# Patient Record
Sex: Male | Born: 1970 | Race: White | Hispanic: No | Marital: Married | Smoking: Current every day smoker
Health system: Southern US, Community
[De-identification: ages and names within clinical notes are randomized; demographics above are authoritative.]

## PROBLEM LIST (undated history)

## (undated) DIAGNOSIS — K529 Noninfective gastroenteritis and colitis, unspecified: Secondary | ICD-10-CM

## (undated) DIAGNOSIS — K5190 Ulcerative colitis, unspecified, without complications: Secondary | ICD-10-CM

## (undated) DIAGNOSIS — F32A Depression, unspecified: Secondary | ICD-10-CM

## (undated) DIAGNOSIS — R7989 Other specified abnormal findings of blood chemistry: Secondary | ICD-10-CM

## (undated) DIAGNOSIS — N509 Disorder of male genital organs, unspecified: Secondary | ICD-10-CM

## (undated) DIAGNOSIS — D126 Benign neoplasm of colon, unspecified: Secondary | ICD-10-CM

## (undated) DIAGNOSIS — F329 Major depressive disorder, single episode, unspecified: Secondary | ICD-10-CM

## (undated) DIAGNOSIS — R12 Heartburn: Secondary | ICD-10-CM

## (undated) DIAGNOSIS — R945 Abnormal results of liver function studies: Secondary | ICD-10-CM

## (undated) DIAGNOSIS — K6289 Other specified diseases of anus and rectum: Secondary | ICD-10-CM

## (undated) HISTORY — DX: Depression, unspecified: F32.A

## (undated) HISTORY — DX: Major depressive disorder, single episode, unspecified: F32.9

## (undated) HISTORY — DX: Other specified diseases of anus and rectum: K62.89

## (undated) HISTORY — DX: Other specified abnormal findings of blood chemistry: R79.89

## (undated) HISTORY — DX: Disorder of male genital organs, unspecified: N50.9

## (undated) HISTORY — DX: Abnormal results of liver function studies: R94.5

## (undated) HISTORY — DX: Ulcerative colitis, unspecified, without complications: K51.90

## (undated) HISTORY — DX: Benign neoplasm of colon, unspecified: D12.6

## (undated) HISTORY — DX: Heartburn: R12

## (undated) HISTORY — DX: Noninfective gastroenteritis and colitis, unspecified: K52.9

---

## 2002-09-11 ENCOUNTER — Emergency Department (HOSPITAL_COMMUNITY): Admission: EM | Admit: 2002-09-11 | Discharge: 2002-09-11 | Payer: Self-pay | Admitting: Emergency Medicine

## 2002-09-11 ENCOUNTER — Encounter: Payer: Self-pay | Admitting: Emergency Medicine

## 2006-01-13 ENCOUNTER — Ambulatory Visit: Payer: Self-pay | Admitting: Gastroenterology

## 2006-01-26 ENCOUNTER — Ambulatory Visit: Payer: Self-pay | Admitting: Gastroenterology

## 2006-01-26 ENCOUNTER — Encounter (INDEPENDENT_AMBULATORY_CARE_PROVIDER_SITE_OTHER): Payer: Self-pay | Admitting: *Deleted

## 2006-03-08 ENCOUNTER — Ambulatory Visit: Payer: Self-pay | Admitting: Gastroenterology

## 2007-08-11 ENCOUNTER — Ambulatory Visit: Payer: Self-pay | Admitting: Gastroenterology

## 2007-10-02 ENCOUNTER — Ambulatory Visit: Payer: Self-pay | Admitting: Gastroenterology

## 2008-01-10 DIAGNOSIS — D126 Benign neoplasm of colon, unspecified: Secondary | ICD-10-CM | POA: Insufficient documentation

## 2008-01-10 DIAGNOSIS — Z9889 Other specified postprocedural states: Secondary | ICD-10-CM

## 2008-01-10 DIAGNOSIS — E299 Testicular dysfunction, unspecified: Secondary | ICD-10-CM | POA: Insufficient documentation

## 2008-01-10 DIAGNOSIS — K5289 Other specified noninfective gastroenteritis and colitis: Secondary | ICD-10-CM | POA: Insufficient documentation

## 2008-01-10 DIAGNOSIS — A088 Other specified intestinal infections: Secondary | ICD-10-CM

## 2008-01-10 DIAGNOSIS — K6289 Other specified diseases of anus and rectum: Secondary | ICD-10-CM | POA: Insufficient documentation

## 2008-02-15 ENCOUNTER — Ambulatory Visit (HOSPITAL_COMMUNITY): Admission: RE | Admit: 2008-02-15 | Discharge: 2008-02-16 | Payer: Self-pay | Admitting: Specialist

## 2008-08-25 ENCOUNTER — Encounter: Payer: Self-pay | Admitting: Gastroenterology

## 2008-08-28 ENCOUNTER — Telehealth: Payer: Self-pay | Admitting: Gastroenterology

## 2008-09-04 ENCOUNTER — Ambulatory Visit: Payer: Self-pay | Admitting: Gastroenterology

## 2008-09-04 DIAGNOSIS — R1013 Epigastric pain: Secondary | ICD-10-CM | POA: Insufficient documentation

## 2008-09-04 DIAGNOSIS — R740 Nonspecific elevation of levels of transaminase and lactic acid dehydrogenase [LDH]: Secondary | ICD-10-CM

## 2008-09-06 ENCOUNTER — Ambulatory Visit (HOSPITAL_COMMUNITY): Admission: RE | Admit: 2008-09-06 | Discharge: 2008-09-06 | Payer: Self-pay | Admitting: Gastroenterology

## 2008-09-09 ENCOUNTER — Telehealth: Payer: Self-pay | Admitting: Internal Medicine

## 2008-09-09 ENCOUNTER — Encounter: Payer: Self-pay | Admitting: Gastroenterology

## 2008-09-09 ENCOUNTER — Ambulatory Visit: Payer: Self-pay | Admitting: Gastroenterology

## 2008-09-09 LAB — CONVERTED CEMR LAB
Albumin: 3.2 g/dL — ABNORMAL LOW (ref 3.5–5.2)
CO2: 31 meq/L (ref 19–32)
GFR calc Af Amer: 123 mL/min
GFR calc non Af Amer: 101 mL/min
Glucose, Bld: 110 mg/dL — ABNORMAL HIGH (ref 70–99)
Hemoglobin: 15.2 g/dL (ref 13.0–17.0)
Lymphocytes Relative: 12.7 % (ref 12.0–46.0)
Monocytes Relative: 11.6 % (ref 3.0–12.0)
Neutro Abs: 9.4 10*3/uL — ABNORMAL HIGH (ref 1.4–7.7)
RBC: 4.91 M/uL (ref 4.22–5.81)
RDW: 12.5 % (ref 11.5–14.6)
Sodium: 138 meq/L (ref 135–145)
Total Bilirubin: 0.8 mg/dL (ref 0.3–1.2)
Total Protein: 7.3 g/dL (ref 6.0–8.3)

## 2008-09-10 ENCOUNTER — Telehealth: Payer: Self-pay | Admitting: Gastroenterology

## 2008-09-12 ENCOUNTER — Emergency Department (HOSPITAL_COMMUNITY): Admission: EM | Admit: 2008-09-12 | Discharge: 2008-09-13 | Payer: Self-pay | Admitting: Emergency Medicine

## 2008-09-12 ENCOUNTER — Encounter: Payer: Self-pay | Admitting: Gastroenterology

## 2008-10-08 ENCOUNTER — Ambulatory Visit: Payer: Self-pay | Admitting: Gastroenterology

## 2008-10-08 DIAGNOSIS — K269 Duodenal ulcer, unspecified as acute or chronic, without hemorrhage or perforation: Secondary | ICD-10-CM | POA: Insufficient documentation

## 2008-10-08 DIAGNOSIS — K5190 Ulcerative colitis, unspecified, without complications: Secondary | ICD-10-CM | POA: Insufficient documentation

## 2008-11-05 ENCOUNTER — Ambulatory Visit: Payer: Self-pay | Admitting: Gastroenterology

## 2009-02-26 ENCOUNTER — Telehealth: Payer: Self-pay | Admitting: Gastroenterology

## 2009-03-18 ENCOUNTER — Ambulatory Visit: Payer: Self-pay | Admitting: Gastroenterology

## 2009-04-07 ENCOUNTER — Telehealth: Payer: Self-pay | Admitting: Gastroenterology

## 2009-04-08 ENCOUNTER — Ambulatory Visit: Payer: Self-pay | Admitting: Gastroenterology

## 2009-04-08 LAB — CONVERTED CEMR LAB
ALT: 20 units/L (ref 0–53)
AST: 14 units/L (ref 0–37)
Albumin: 3.3 g/dL — ABNORMAL LOW (ref 3.5–5.2)
Basophils Absolute: 0 10*3/uL (ref 0.0–0.1)
Calcium: 9.1 mg/dL (ref 8.4–10.5)
Chloride: 102 meq/L (ref 96–112)
Eosinophils Absolute: 0 10*3/uL (ref 0.0–0.7)
Lymphocytes Relative: 11.8 % — ABNORMAL LOW (ref 12.0–46.0)
MCHC: 35.3 g/dL (ref 30.0–36.0)
Neutrophils Relative %: 72.2 % (ref 43.0–77.0)
Platelets: 267 10*3/uL (ref 150.0–400.0)
Potassium: 3.4 meq/L — ABNORMAL LOW (ref 3.5–5.1)
RBC: 4.56 M/uL (ref 4.22–5.81)
RDW: 13 % (ref 11.5–14.6)

## 2009-04-09 ENCOUNTER — Inpatient Hospital Stay (HOSPITAL_COMMUNITY): Admission: EM | Admit: 2009-04-09 | Discharge: 2009-04-18 | Payer: Self-pay | Admitting: Emergency Medicine

## 2009-04-09 ENCOUNTER — Encounter: Payer: Self-pay | Admitting: Gastroenterology

## 2009-04-09 ENCOUNTER — Ambulatory Visit: Payer: Self-pay | Admitting: Internal Medicine

## 2009-04-09 ENCOUNTER — Telehealth: Payer: Self-pay | Admitting: Gastroenterology

## 2009-04-14 ENCOUNTER — Encounter: Payer: Self-pay | Admitting: Internal Medicine

## 2009-04-23 ENCOUNTER — Encounter: Payer: Self-pay | Admitting: Gastroenterology

## 2009-04-23 ENCOUNTER — Ambulatory Visit: Payer: Self-pay | Admitting: Internal Medicine

## 2009-04-23 ENCOUNTER — Encounter: Payer: Self-pay | Admitting: Nurse Practitioner

## 2009-04-23 DIAGNOSIS — K5130 Ulcerative (chronic) rectosigmoiditis without complications: Secondary | ICD-10-CM | POA: Insufficient documentation

## 2009-04-23 DIAGNOSIS — R12 Heartburn: Secondary | ICD-10-CM

## 2009-04-29 ENCOUNTER — Telehealth: Payer: Self-pay | Admitting: Gastroenterology

## 2009-04-29 ENCOUNTER — Telehealth (INDEPENDENT_AMBULATORY_CARE_PROVIDER_SITE_OTHER): Payer: Self-pay | Admitting: *Deleted

## 2009-04-30 ENCOUNTER — Telehealth: Payer: Self-pay | Admitting: Gastroenterology

## 2009-05-07 ENCOUNTER — Telehealth: Payer: Self-pay | Admitting: Gastroenterology

## 2009-05-13 ENCOUNTER — Encounter (HOSPITAL_COMMUNITY): Admission: RE | Admit: 2009-05-13 | Discharge: 2009-08-11 | Payer: Self-pay | Admitting: Gastroenterology

## 2009-05-14 ENCOUNTER — Encounter: Payer: Self-pay | Admitting: Gastroenterology

## 2009-05-15 ENCOUNTER — Telehealth: Payer: Self-pay | Admitting: Gastroenterology

## 2009-05-23 ENCOUNTER — Ambulatory Visit: Payer: Self-pay | Admitting: Gastroenterology

## 2009-05-26 LAB — CONVERTED CEMR LAB
BUN: 17 mg/dL (ref 6–23)
Basophils Relative: 1.1 % (ref 0.0–3.0)
Calcium: 8.8 mg/dL (ref 8.4–10.5)
Eosinophils Absolute: 0.2 10*3/uL (ref 0.0–0.7)
GFR calc non Af Amer: 115.68 mL/min (ref >60)
HCT: 35.2 % — ABNORMAL LOW (ref 39.0–52.0)
Lymphs Abs: 2.8 10*3/uL (ref 0.7–4.0)
MCHC: 33.6 g/dL (ref 30.0–36.0)
MCV: 90.4 fL (ref 78.0–100.0)
Monocytes Absolute: 0.9 10*3/uL (ref 0.1–1.0)
Neutrophils Relative %: 66.8 % (ref 43.0–77.0)
Platelets: 336 10*3/uL (ref 150.0–400.0)
Potassium: 3.4 meq/L — ABNORMAL LOW (ref 3.5–5.1)
RBC: 3.89 M/uL — ABNORMAL LOW (ref 4.22–5.81)

## 2009-05-27 ENCOUNTER — Ambulatory Visit: Payer: Self-pay | Admitting: Gastroenterology

## 2009-06-11 ENCOUNTER — Ambulatory Visit: Payer: Self-pay | Admitting: Gastroenterology

## 2009-06-12 ENCOUNTER — Telehealth (INDEPENDENT_AMBULATORY_CARE_PROVIDER_SITE_OTHER): Payer: Self-pay | Admitting: *Deleted

## 2009-06-12 LAB — CONVERTED CEMR LAB
ALT: 48 units/L (ref 0–53)
AST: 35 units/L (ref 0–37)
Alkaline Phosphatase: 34 units/L — ABNORMAL LOW (ref 39–117)
Basophils Absolute: 0.1 10*3/uL (ref 0.0–0.1)
CO2: 30 meq/L (ref 19–32)
Creatinine, Ser: 0.8 mg/dL (ref 0.4–1.5)
Eosinophils Absolute: 0.1 10*3/uL (ref 0.0–0.7)
GFR calc non Af Amer: 115.65 mL/min (ref >60)
HCT: 35.6 % — ABNORMAL LOW (ref 39.0–52.0)
Hemoglobin: 11.9 g/dL — ABNORMAL LOW (ref 13.0–17.0)
Lymphs Abs: 2.4 10*3/uL (ref 0.7–4.0)
MCHC: 33.4 g/dL (ref 30.0–36.0)
MCV: 90.8 fL (ref 78.0–100.0)
Monocytes Absolute: 0.8 10*3/uL (ref 0.1–1.0)
Neutro Abs: 10.4 10*3/uL — ABNORMAL HIGH (ref 1.4–7.7)
Platelets: 262 10*3/uL (ref 150.0–400.0)
RDW: 15.3 % — ABNORMAL HIGH (ref 11.5–14.6)
Sodium: 142 meq/L (ref 135–145)
Total Bilirubin: 1.1 mg/dL (ref 0.3–1.2)
Total Protein: 6.4 g/dL (ref 6.0–8.3)

## 2009-06-24 ENCOUNTER — Ambulatory Visit: Payer: Self-pay | Admitting: Gastroenterology

## 2009-06-25 ENCOUNTER — Telehealth (INDEPENDENT_AMBULATORY_CARE_PROVIDER_SITE_OTHER): Payer: Self-pay | Admitting: *Deleted

## 2009-06-25 LAB — CONVERTED CEMR LAB
Basophils Absolute: 0.1 10*3/uL (ref 0.0–0.1)
Basophils Relative: 0.9 % (ref 0.0–3.0)
CO2: 30 meq/L (ref 19–32)
Calcium: 8.9 mg/dL (ref 8.4–10.5)
Creatinine, Ser: 1.1 mg/dL (ref 0.4–1.5)
Eosinophils Absolute: 0.1 10*3/uL (ref 0.0–0.7)
GFR calc non Af Amer: 80.07 mL/min (ref >60)
Hemoglobin: 13.4 g/dL (ref 13.0–17.0)
Lymphocytes Relative: 27.8 % (ref 12.0–46.0)
MCHC: 34.3 g/dL (ref 30.0–36.0)
Monocytes Relative: 6.9 % (ref 3.0–12.0)
Neutro Abs: 4.5 10*3/uL (ref 1.4–7.7)
Neutrophils Relative %: 63.1 % (ref 43.0–77.0)
RBC: 4.2 M/uL — ABNORMAL LOW (ref 4.22–5.81)
RDW: 15.8 % — ABNORMAL HIGH (ref 11.5–14.6)
Sodium: 145 meq/L (ref 135–145)

## 2009-07-11 ENCOUNTER — Telehealth: Payer: Self-pay | Admitting: Gastroenterology

## 2009-08-06 IMAGING — US US ABDOMEN COMPLETE
1 series · 14 of 25 positions shown · non-contrast
Comparison: None

CLINICAL DATA: Abnormal LFTs

ABDOMEN ULTRASOUND
TECHNIQUE: Complete abdominal ultrasound examination was performed
including evaluation of the liver, gallbladder, bile ducts,
pancreas, kidneys, spleen, IVC, and abdominal aorta.

[Series 1: unknown · 0.27mm/px · 14 of 56 slices shown]
[im 1/56]
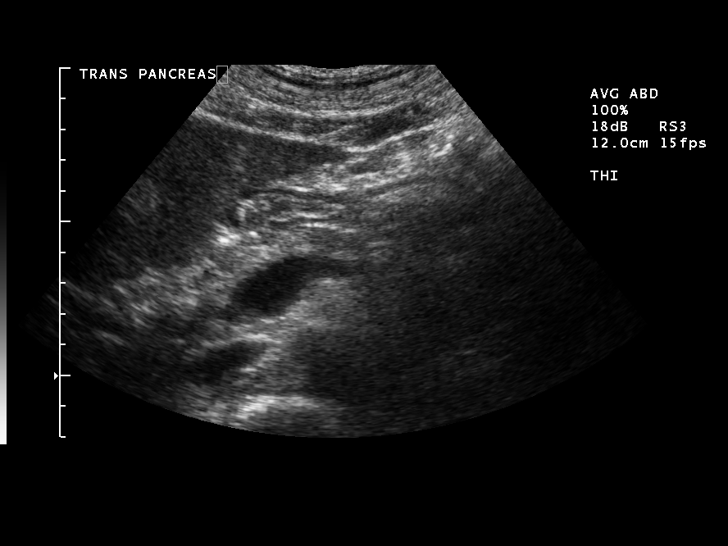
[im 5/56]
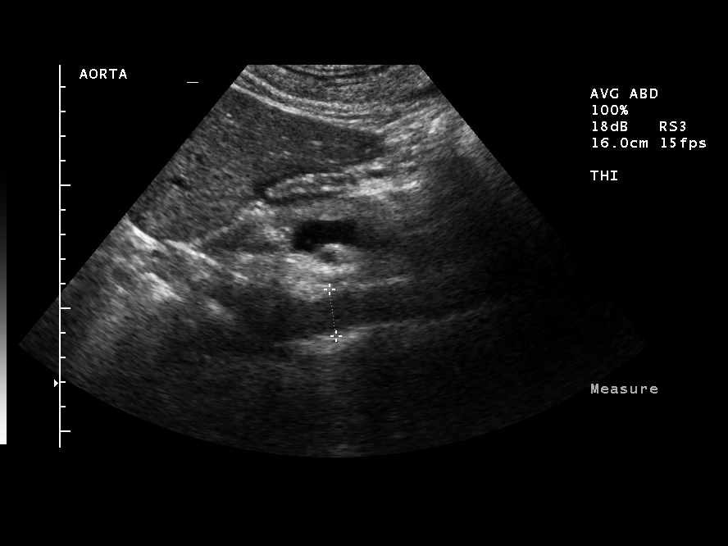
[im 10/56]
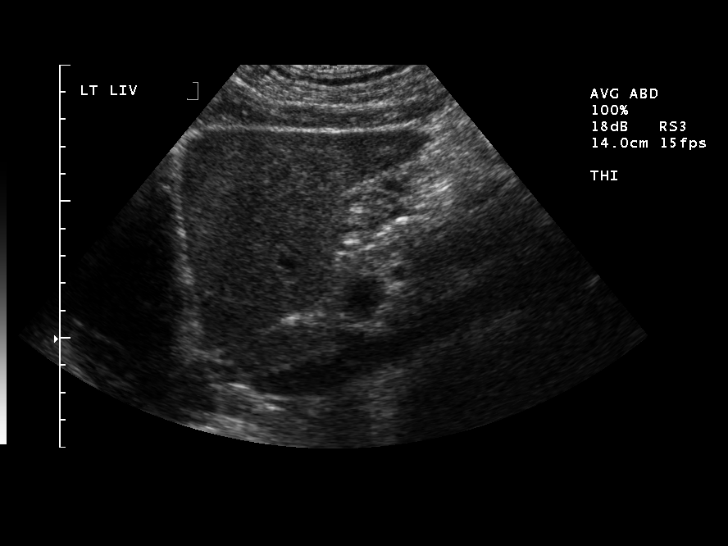
[im 14/56]
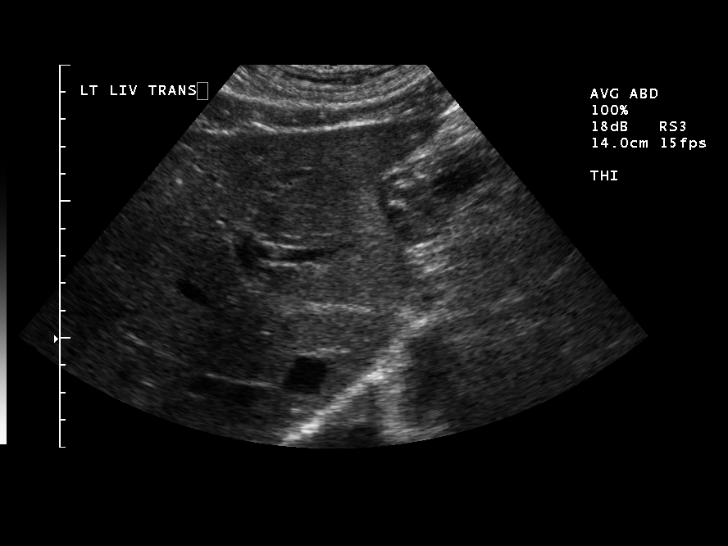
[im 19/56]
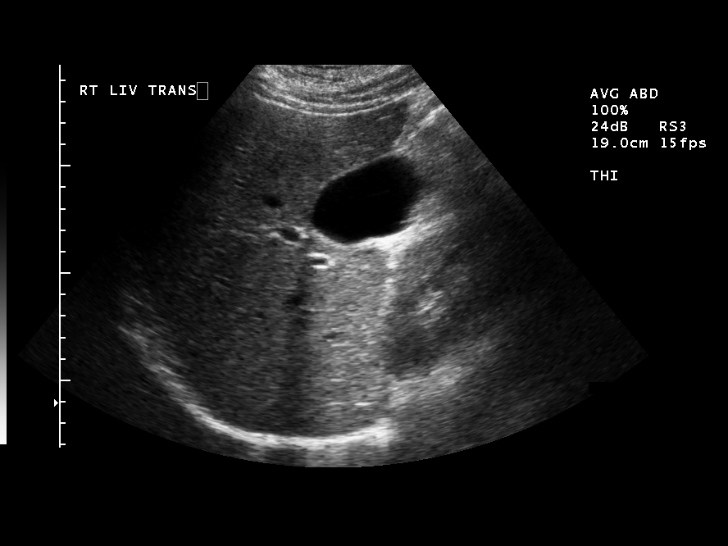
[im 21/56]
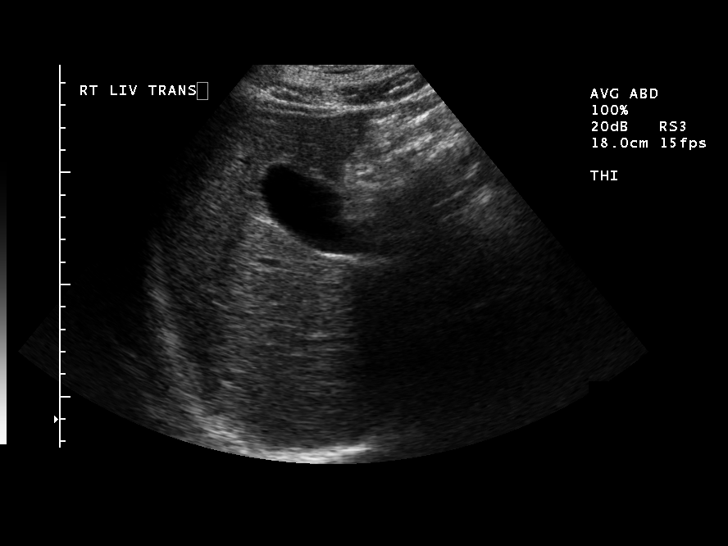
[im 26/56]
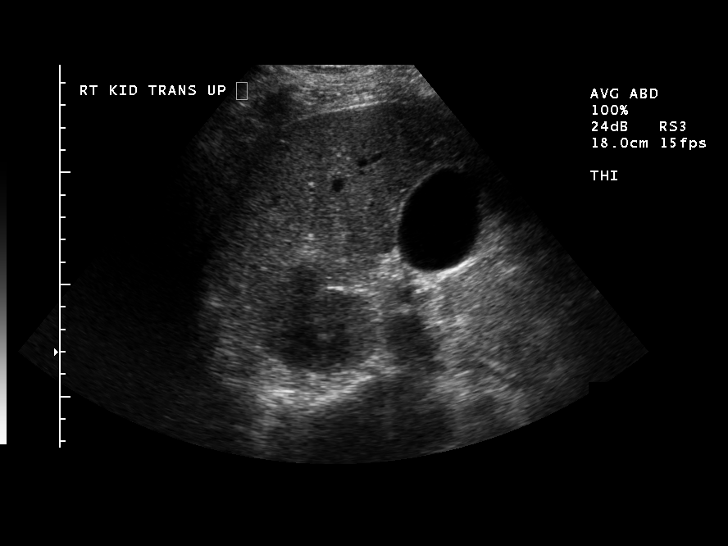
[im 30/56]
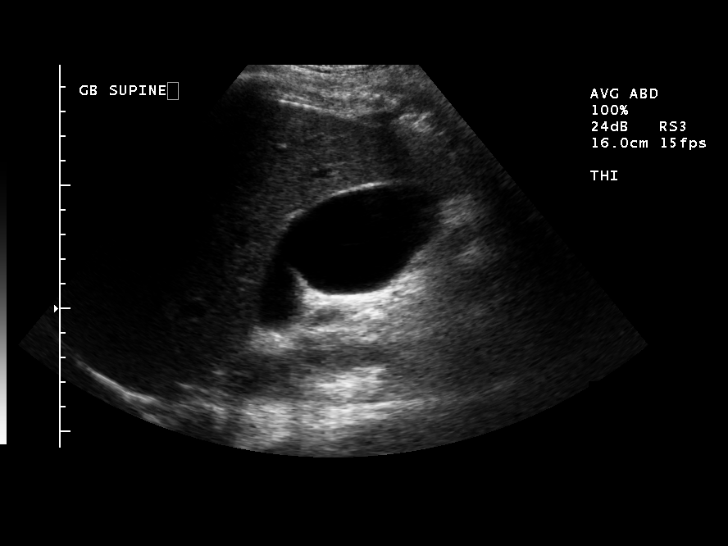
[im 35/56]
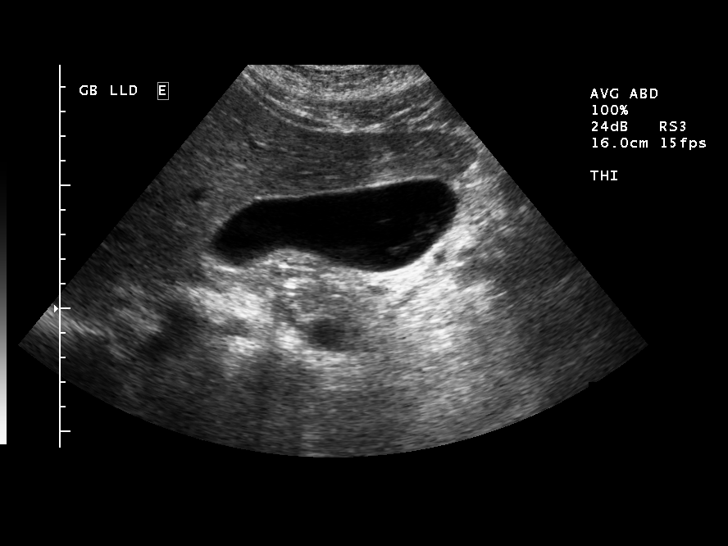
[im 37/56]
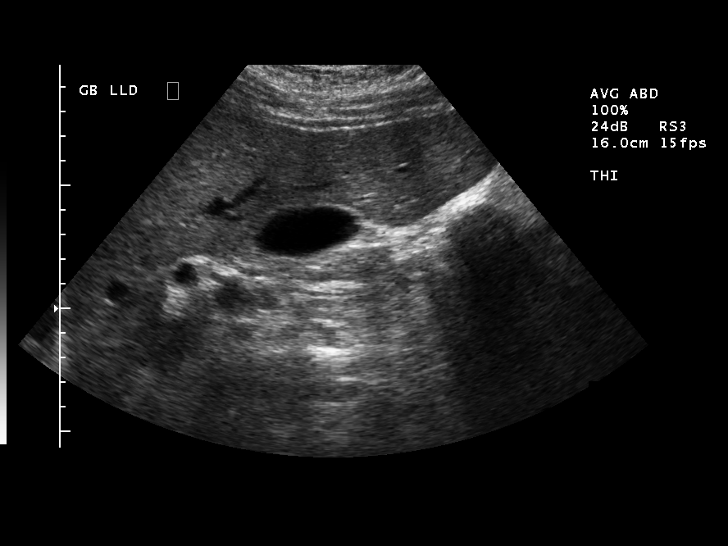
[im 42/56]
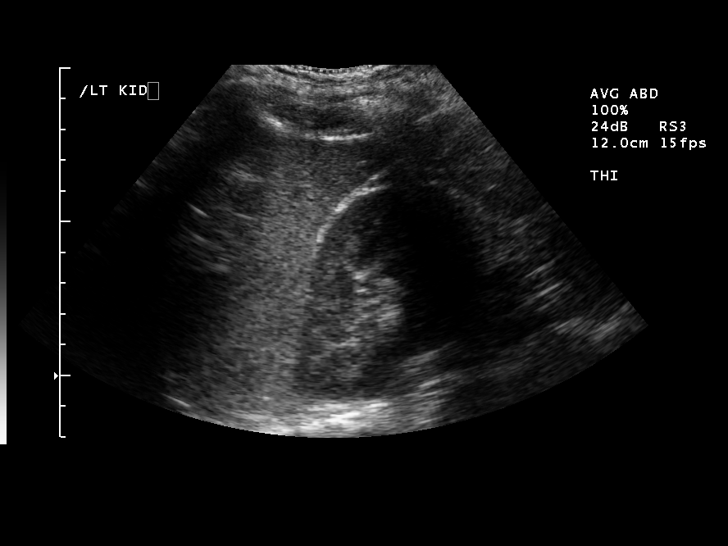
[im 46/56]
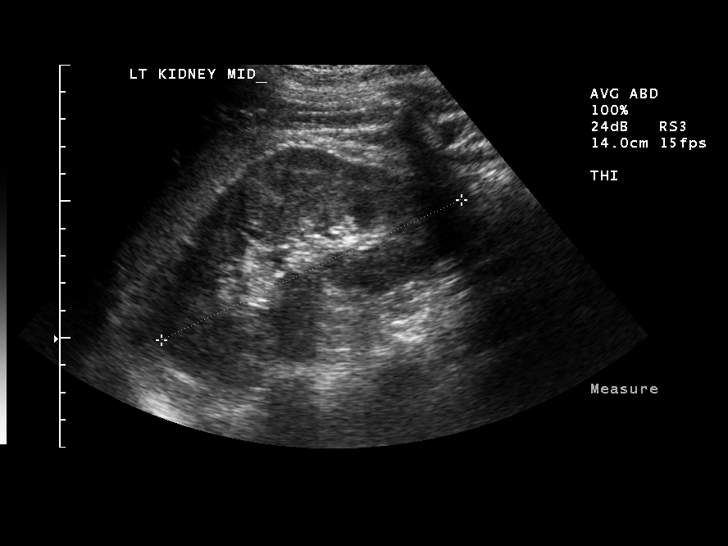
[im 51/56]
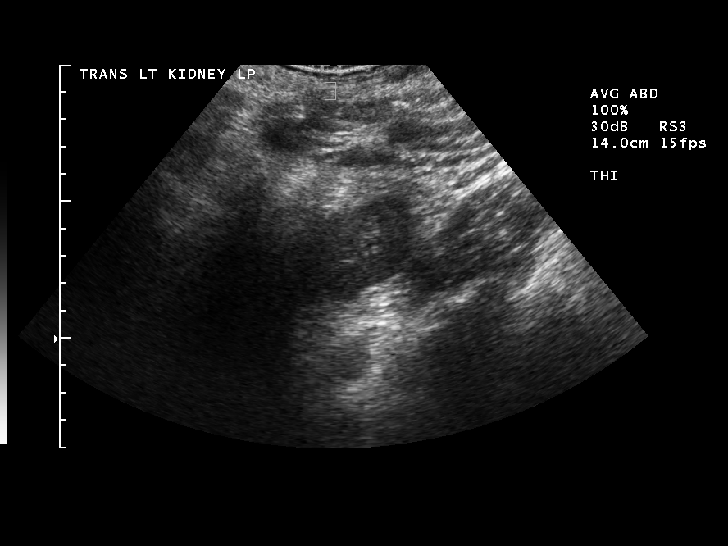
[im 56/56]
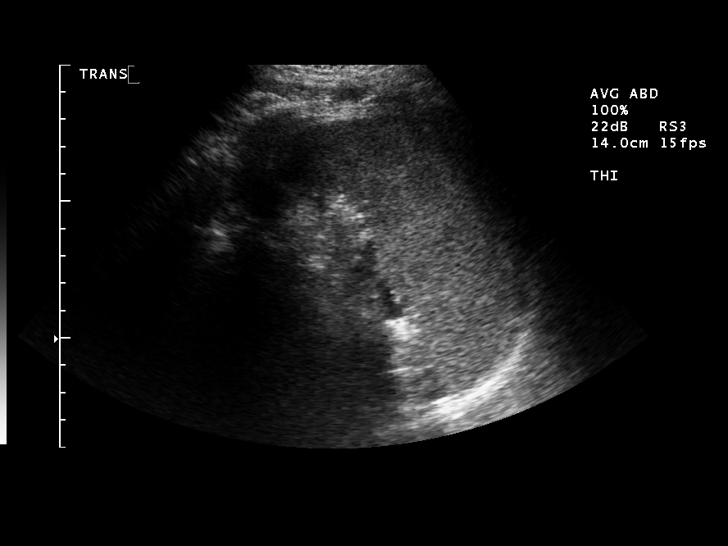

[14 of 25 positions shown; findings below may reference images not displayed]

FINDINGS: Normal gallbladder.  Negative Murphy's sign.  Gallbladder
wall thickness is 2.7 mm.  Common duct measures 3.3 mm which is
within normal limits.  No hepatic abnormality.  Pancreatic tail is
not visualized due to adjacent bowel gas.  Pancreas otherwise
appears normal. .  The spleen and kidneys are unremarkable.  The
spleen measures 8.8 cm in length.  The right and left kidneys
measure 12.6 and 12.1 cm in length, respectively.  Patent IVC.
Abdominal aorta maximal diameter is 2.0 cm.
IMPRESSION: No hepatic abnormality.  Normal gallbladder.  No biliary ductal
dilatation.  See comments above.

## 2009-08-14 ENCOUNTER — Encounter: Payer: Self-pay | Admitting: Gastroenterology

## 2009-08-18 ENCOUNTER — Encounter (HOSPITAL_COMMUNITY): Admission: RE | Admit: 2009-08-18 | Discharge: 2009-11-16 | Payer: Self-pay | Admitting: Gastroenterology

## 2009-09-08 ENCOUNTER — Ambulatory Visit: Payer: Self-pay | Admitting: Gastroenterology

## 2009-09-09 ENCOUNTER — Ambulatory Visit: Payer: Self-pay | Admitting: Gastroenterology

## 2009-09-09 LAB — CONVERTED CEMR LAB
BUN: 19 mg/dL (ref 6–23)
Basophils Relative: 0.9 % (ref 0.0–3.0)
CO2: 30 meq/L (ref 19–32)
Calcium: 8.7 mg/dL (ref 8.4–10.5)
Chloride: 102 meq/L (ref 96–112)
Creatinine, Ser: 0.8 mg/dL (ref 0.4–1.5)
GFR calc non Af Amer: 115.49 mL/min (ref >60)
Glucose, Bld: 94 mg/dL (ref 70–99)
HCT: 38.8 % — ABNORMAL LOW (ref 39.0–52.0)
Hemoglobin: 13.5 g/dL (ref 13.0–17.0)
Lymphocytes Relative: 20.2 % (ref 12.0–46.0)
Lymphs Abs: 1.7 10*3/uL (ref 0.7–4.0)
Monocytes Relative: 6.3 % (ref 3.0–12.0)
Neutro Abs: 5.9 10*3/uL (ref 1.4–7.7)
RBC: 4.23 M/uL (ref 4.22–5.81)
Total Bilirubin: 1.2 mg/dL (ref 0.3–1.2)

## 2009-09-22 ENCOUNTER — Telehealth (INDEPENDENT_AMBULATORY_CARE_PROVIDER_SITE_OTHER): Payer: Self-pay | Admitting: *Deleted

## 2009-09-23 ENCOUNTER — Ambulatory Visit: Payer: Self-pay | Admitting: Gastroenterology

## 2009-09-24 LAB — CONVERTED CEMR LAB
ALT: 20 units/L (ref 0–53)
Basophils Absolute: 0.1 10*3/uL (ref 0.0–0.1)
Basophils Relative: 1.2 % (ref 0.0–3.0)
CO2: 30 meq/L (ref 19–32)
Calcium: 9.2 mg/dL (ref 8.4–10.5)
Chloride: 106 meq/L (ref 96–112)
Creatinine, Ser: 0.9 mg/dL (ref 0.4–1.5)
Eosinophils Relative: 1.8 % (ref 0.0–5.0)
GFR calc non Af Amer: 100.79 mL/min (ref >60)
Glucose, Bld: 90 mg/dL (ref 70–99)
Hemoglobin: 13.1 g/dL (ref 13.0–17.0)
Lymphocytes Relative: 34.9 % (ref 12.0–46.0)
Monocytes Relative: 7.8 % (ref 3.0–12.0)
Neutro Abs: 3.1 10*3/uL (ref 1.4–7.7)
RBC: 3.94 M/uL — ABNORMAL LOW (ref 4.22–5.81)
RDW: 14.1 % (ref 11.5–14.6)
Total Protein: 6.4 g/dL (ref 6.0–8.3)
WBC: 5.8 10*3/uL (ref 4.5–10.5)

## 2009-11-05 ENCOUNTER — Ambulatory Visit: Payer: Self-pay | Admitting: Gastroenterology

## 2009-12-24 ENCOUNTER — Telehealth (INDEPENDENT_AMBULATORY_CARE_PROVIDER_SITE_OTHER): Payer: Self-pay | Admitting: *Deleted

## 2009-12-30 ENCOUNTER — Telehealth: Payer: Self-pay | Admitting: Gastroenterology

## 2010-01-12 ENCOUNTER — Ambulatory Visit: Payer: Self-pay | Admitting: Gastroenterology

## 2010-01-13 LAB — CONVERTED CEMR LAB
ALT: 20 units/L (ref 0–53)
AST: 18 units/L (ref 0–37)
Basophils Absolute: 0 10*3/uL (ref 0.0–0.1)
Chloride: 104 meq/L (ref 96–112)
Creatinine, Ser: 0.8 mg/dL (ref 0.4–1.5)
Eosinophils Absolute: 0.1 10*3/uL (ref 0.0–0.7)
HCT: 38.2 % — ABNORMAL LOW (ref 39.0–52.0)
Hemoglobin: 13 g/dL (ref 13.0–17.0)
Lymphs Abs: 1.6 10*3/uL (ref 0.7–4.0)
MCHC: 34 g/dL (ref 30.0–36.0)
Monocytes Absolute: 0.2 10*3/uL (ref 0.1–1.0)
Neutro Abs: 2.7 10*3/uL (ref 1.4–7.7)
RDW: 13.9 % (ref 11.5–14.6)
Sodium: 141 meq/L (ref 135–145)
Total Bilirubin: 0.9 mg/dL (ref 0.3–1.2)

## 2010-01-16 ENCOUNTER — Encounter (INDEPENDENT_AMBULATORY_CARE_PROVIDER_SITE_OTHER): Payer: Self-pay | Admitting: *Deleted

## 2010-01-20 ENCOUNTER — Telehealth: Payer: Self-pay | Admitting: Gastroenterology

## 2010-02-25 ENCOUNTER — Ambulatory Visit: Payer: Self-pay | Admitting: Gastroenterology

## 2010-03-09 IMAGING — CT CT ABDOMEN W/ CM
1 of 2 series · 15 of 32 positions shown, 19 images · IV contrast (agent unspecified)
Comparison: None

CT ABDOMEN

CLINICAL DATA: Abdominal pain.  Difficulty voiding.

CT ABDOMEN AND PELVIS WITH CONTRAST
TECHNIQUE: Multidetector CT imaging of the abdomen and pelvis was
performed using the standard protocol following bolus
administration of intravenous contrast.
Contrast: 100 ml of Lmnipaque-855

[Series 2: rtn ap with st · axial · 0.78mm/px · z∈[-511,-111]mm · 15 of 88 slices shown, 19 images]
[im 4/88  soft-tissue]
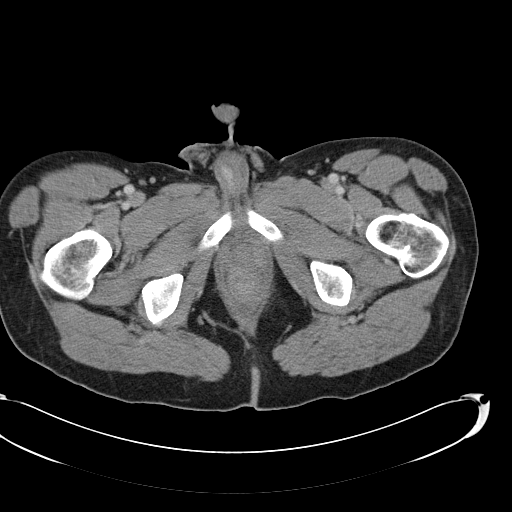
[im 4/88  bone]
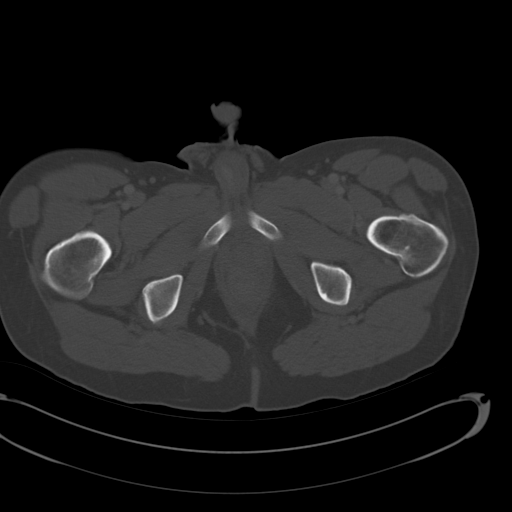
[im 11/88  soft-tissue]
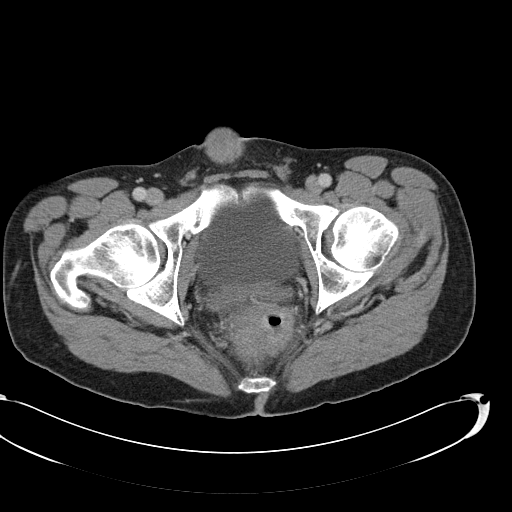
[im 17/88  soft-tissue]
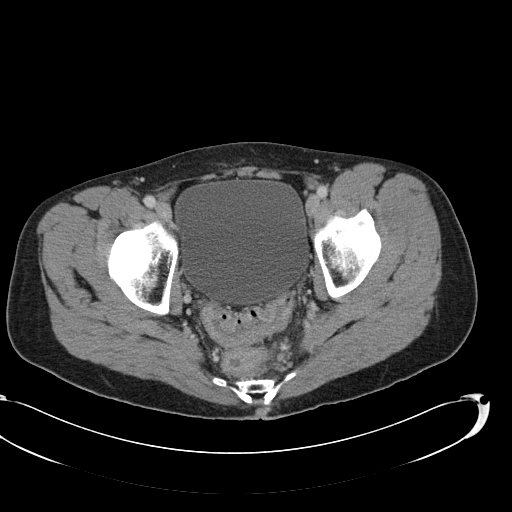
[im 24/88  soft-tissue]
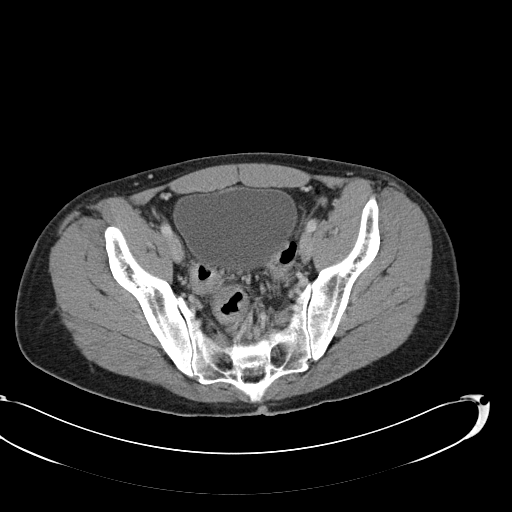
[im 31/88  soft-tissue]
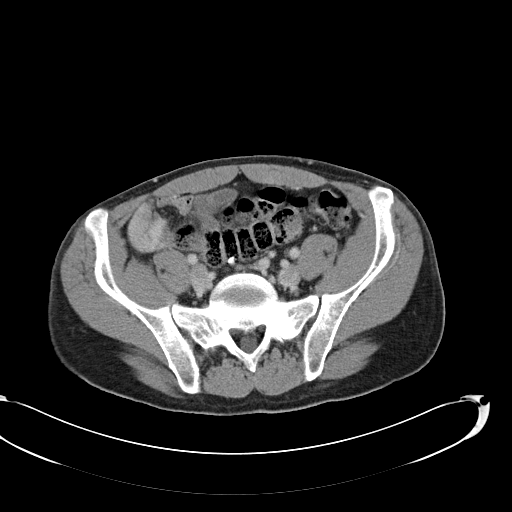
[im 37/88  soft-tissue]
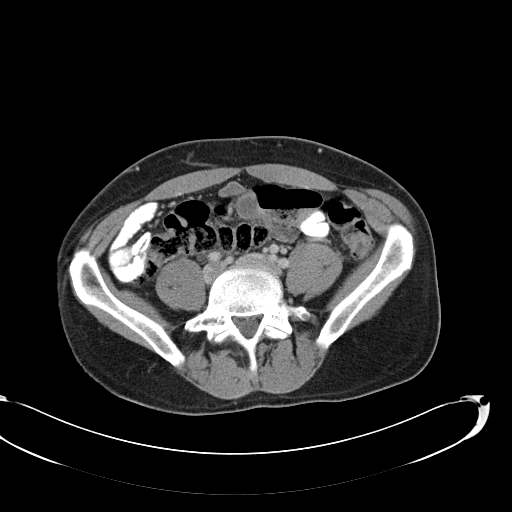
[im 44/88  soft-tissue]
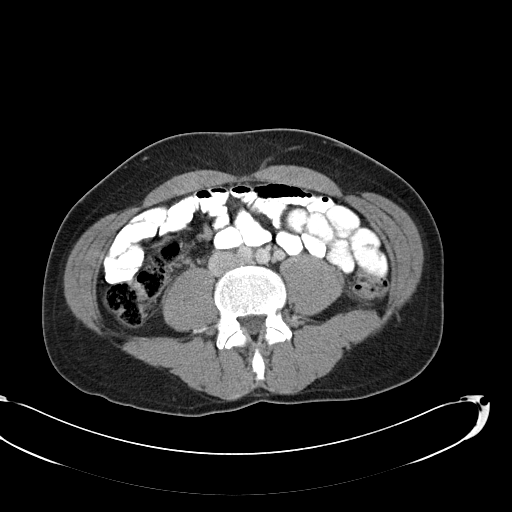
[im 51/88  soft-tissue]
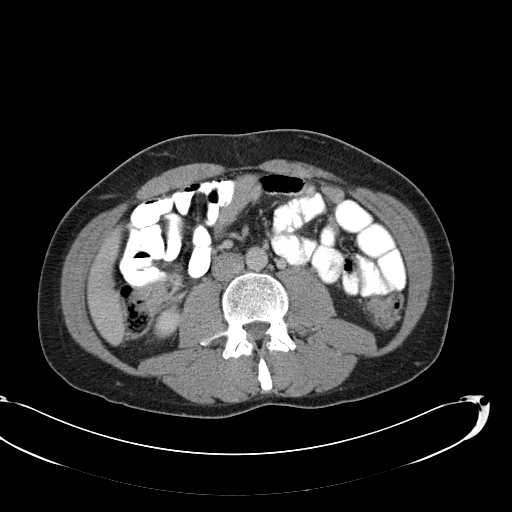
[im 57/88  soft-tissue]
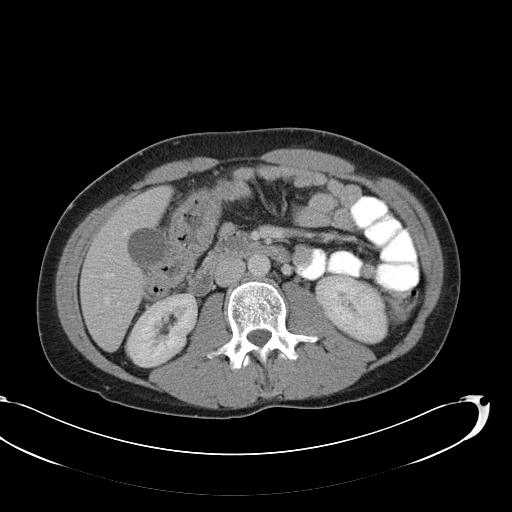
[im 57/88  bone]
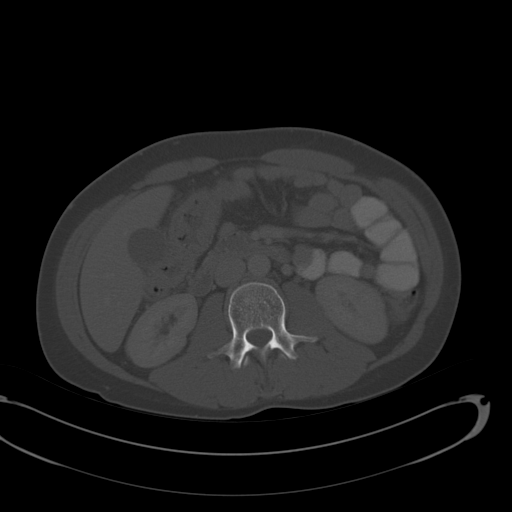
[im 64/88  soft-tissue]
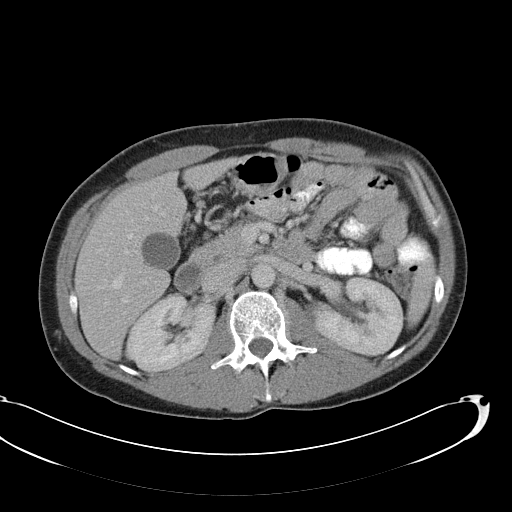
[im 71/88  soft-tissue]
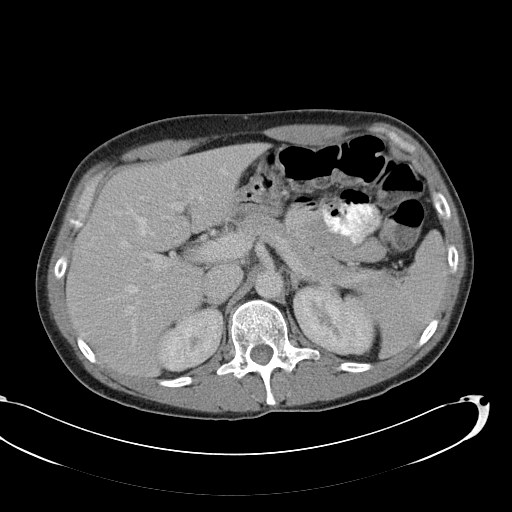
[im 74/88  lung]
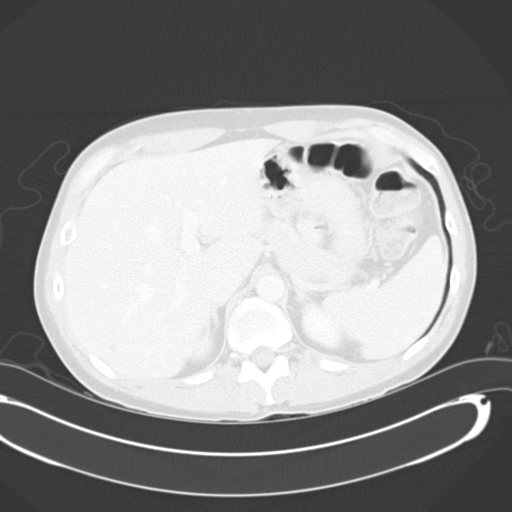
[im 77/88  soft-tissue]
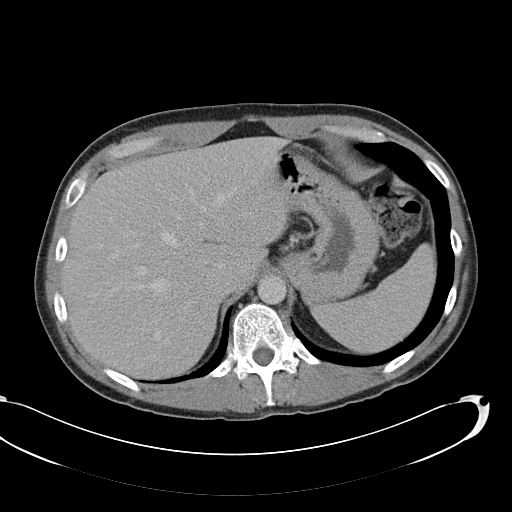
[im 77/88  lung]
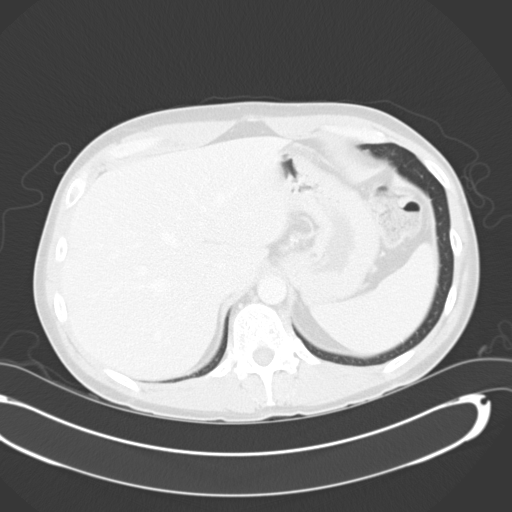
[im 81/88  lung]
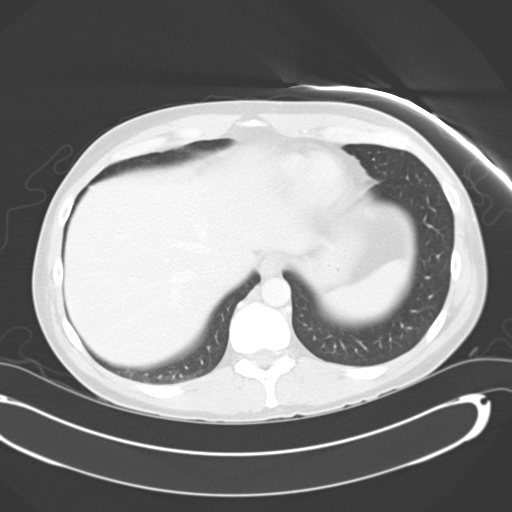
[im 84/88  soft-tissue]
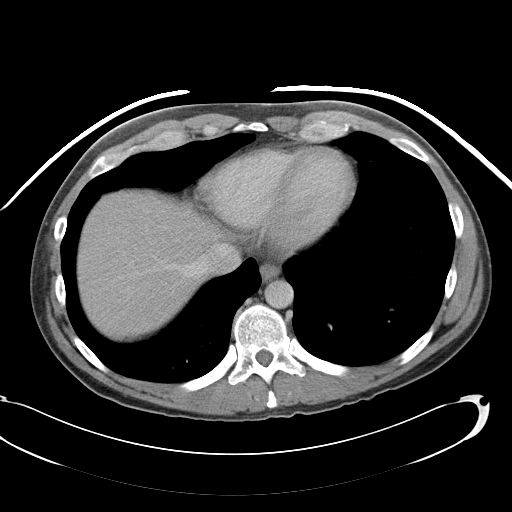
[im 84/88  lung]
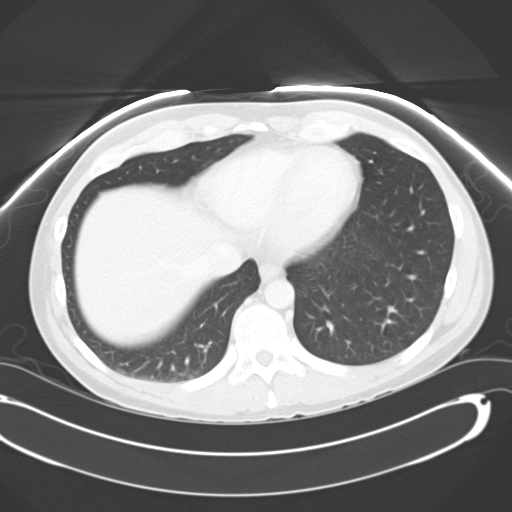

[15 of 32 positions shown; findings below may reference images not displayed]

FINDINGS: The lung bases are clear.  The liver demonstrates no
significant abnormalities.  There is a small low attenuation lesion
in the right hepatic lobe posteriorly which is most likely a benign
cyst or small hemangioma.  No biliary dilatation.  Gallbladder
appears normal.  The spleen is normal in size.  The pancreas,
adrenal glands and kidneys demonstrate no significant
abnormalities.

The the stomach is not well distended with contrast.  No gross
abnormalities are seen.  The duodenum, small bowel and colon
demonstrate no significant abnormalities.  No mesenteric or
retroperitoneal masses or adenopathy.  The aorta is normal in
caliber.  The bony structures are unremarkable.
IMPRESSION: 1.  No acute abdominal findings, mass lesions or adenopathy.
2.  Small low attenuation liver lesion is most likely a small cyst
or benign hemangioma.

CT PELVIS
FINDINGS: The rectum, sigmoid colon and visualized small bowel
loops are grossly normal.  There is a low transverse cecum in the
left lower quadrant/upper pelvis.  The appendix is visualized and
is normal.  Radiodensities noted the cecum are likely pills.  The
bladder is mildly distended.  No bladder mass or calculi.  No
pelvic mass, adenopathy or free pelvic fluid collection.  No
inguinal mass or hernia.  The bony pelvis is intact.
IMPRESSION: 1.  No acute pelvic findings, mass lesions or adenopathy.
2.  Mild bladder distention.

## 2010-04-17 ENCOUNTER — Telehealth: Payer: Self-pay | Admitting: Gastroenterology

## 2010-05-13 ENCOUNTER — Telehealth (INDEPENDENT_AMBULATORY_CARE_PROVIDER_SITE_OTHER): Payer: Self-pay | Admitting: *Deleted

## 2010-05-22 ENCOUNTER — Ambulatory Visit: Payer: Self-pay | Admitting: Gastroenterology

## 2010-05-26 LAB — CONVERTED CEMR LAB
ALT: 33 units/L (ref 0–53)
Basophils Absolute: 0 10*3/uL (ref 0.0–0.1)
CO2: 29 meq/L (ref 19–32)
Chloride: 106 meq/L (ref 96–112)
GFR calc non Af Amer: 104.44 mL/min (ref >60)
Lymphocytes Relative: 21.7 % (ref 12.0–46.0)
Lymphs Abs: 1.4 10*3/uL (ref 0.7–4.0)
Monocytes Relative: 5.6 % (ref 3.0–12.0)
Platelets: 196 10*3/uL (ref 150.0–400.0)
RDW: 15.5 % — ABNORMAL HIGH (ref 11.5–14.6)
Sodium: 142 meq/L (ref 135–145)
Total Bilirubin: 0.5 mg/dL (ref 0.3–1.2)
Total Protein: 6.4 g/dL (ref 6.0–8.3)

## 2010-06-10 ENCOUNTER — Telehealth: Payer: Self-pay | Admitting: Gastroenterology

## 2010-08-26 ENCOUNTER — Telehealth (INDEPENDENT_AMBULATORY_CARE_PROVIDER_SITE_OTHER): Payer: Self-pay | Admitting: *Deleted

## 2010-10-02 ENCOUNTER — Ambulatory Visit: Payer: Self-pay | Admitting: Gastroenterology

## 2010-10-05 LAB — CONVERTED CEMR LAB
ALT: 23 units/L (ref 0–53)
AST: 24 units/L (ref 0–37)
Alkaline Phosphatase: 50 units/L (ref 39–117)
Basophils Absolute: 0 10*3/uL (ref 0.0–0.1)
Eosinophils Absolute: 0.1 10*3/uL (ref 0.0–0.7)
HCT: 40.2 % (ref 39.0–52.0)
Lymphs Abs: 1.6 10*3/uL (ref 0.7–4.0)
MCHC: 35 g/dL (ref 30.0–36.0)
Monocytes Absolute: 0.4 10*3/uL (ref 0.1–1.0)
Monocytes Relative: 5.8 % (ref 3.0–12.0)
Platelets: 226 10*3/uL (ref 150.0–400.0)
RDW: 15.1 % — ABNORMAL HIGH (ref 11.5–14.6)
Sodium: 139 meq/L (ref 135–145)
Total Bilirubin: 0.8 mg/dL (ref 0.3–1.2)
Total Protein: 6.4 g/dL (ref 6.0–8.3)

## 2010-11-11 ENCOUNTER — Ambulatory Visit: Payer: Self-pay | Admitting: Gastroenterology

## 2010-12-24 NOTE — Assessment & Plan Note (Signed)
  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  Colonoscopy March 2007 showed mild inflammation up to 15 cm.  Biopsies proved chronic active colitis, responded very well to daily Canasa suppositories. Repeat colonoscopy October 2009 during new flare showed moderate pan colitis.  Responded to steroids quickly. He had not been taking asacol except for intermittent minor flares.  Flex sigmoidoscopy May, 2010: Severe, left-sided predominately colitis with a transition at 30 cm. Also a tubular adenoma was removed.  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  June, 2010 increased azathioprine 150 mg a day.  October, 2010 increased azathioprine to 200 mg a day.  Stopped remicade, per patient request, and on solo azathiaprian October 2010. 2. Duodenal ulcer, EGD october 2009, in setting of NSAIDs/steroids.  Biopsies of stomach showed no H. pylori.   History of Present Illness Visit Type: Follow-up Visit Primary GI MD: DOwens LofflerMD Primary Provider: n/a Requesting Provider: n/a Chief Complaint: rectal bleed History of Present Illness:     very pleasant 40year old man whom I last saw about 4 months ago. He had a CBC, complete metabolic profile 2 months ago and these show that he is tolerating his azathioprine very well.  He is doing well.  Completely normal: he is not having any bleeding.  He has one solid brown BM daily.  These became a bit softer during some recent antibiotics.   No abd cramping.  Has a very RARE urgency.           Current Medications (verified): 1)  Azathioprine 50 Mg Tabs (Azathioprine) .... Take 4 Pills A Day  Allergies (verified): No Known Drug Allergies  Vital Signs:  Patient profile:   40year old male Height:      72  inches Weight:      177.50 pounds BMI:     24.16 Pulse rate:   76 / minute Pulse rhythm:   regular BP sitting:   102 / 68  (left arm) Cuff size:   regular  Vitals Entered By: June McMurray CDaingerfield(Deborra Medina (February 25, 2010 1:53 PM)  Physical Exam  Additional Exam:  Constitutional: generally well appearing Psychiatric: alert and oriented times 3 Abdomen: soft, non-tender, non-distended, normal bowel sounds    Impression & Recommendations:  Problem # 1:  ULCERATIVE COLITIS he is tolerating his azathioprine dose very well and we'll continue at the current dose. He'll have a repeat set of blood tests in 2 months time and he will return to see me in 4 months time.  Patient Instructions: 1)  Continue azathiaprine at current dose (2034ma day). 2)  Repeat cbc, cmet in late June. 3)  Return office visit in 4-5 months. 4)  The medication list was reviewed and reconciled.  All changed / newly prescribed medications were explained.  A complete medication list was provided to the patient / caregiver.

## 2010-12-24 NOTE — Progress Notes (Signed)
Summary: Med refill  Medications Added AZATHIOPRINE 50 MG TABS (AZATHIOPRINE) take 4 pills a day       Phone Note Call from Patient Call back at [facility] Phone [phone]   Caller: Patient Call For: Dr. Ardis Hughs Reason for Call: Refill Medication Summary of Call: pt. need a refill of [facility]athioprine.[facility] [place] Initial call taken by: Webb Laws,  Apr 17, 2010 8:19 AM  Follow-up for Phone Call        patient aware of refills Barb Merino RN, CGRN  Apr 17, 2010 8:35 AM    New/Updated Medications: AZATHIOPRINE 50 MG TABS (AZATHIOPRINE) take 4 pills a day Prescriptions: AZATHIOPRINE 50 MG TABS (AZATHIOPRINE) take 4 pills a day  #120 x 3   Entered by:   Barb Merino RN, CGRN   Authorized by:   Milus Banister MD   Signed by:   Barb Merino RN, CGRN on 04/17/2010   Method used:   Electronically to        [address] [address] # [phone]* (retail)       [address]       [place], [place]  [address]       Ph: [phone]       Fax: [phone]   RxID:   [id number]

## 2010-12-24 NOTE — Progress Notes (Signed)
Summary: lab reminder   Phone Note Outgoing Call Call back at Home Phone [phone]   Call placed by: Christian Mate CMA Deborra Medina),  May 13, 2010 9:00 AM Summary of Call: called and reminded pt to have labs done this week Initial call taken by: Christian Mate CMA ([place]),  May 13, 2010 9:00 AM

## 2010-12-24 NOTE — Assessment & Plan Note (Signed)
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  Colonoscopy March 2007 showed mild inflammation up to 15 cm.  Biopsies proved chronic active colitis, responded very well to daily Canasa suppositories. Repeat colonoscopy October 2009 during new flare showed moderate pan colitis.  Responded to steroids quickly. He had not been taking asacol except for intermittent minor flares.  Flex sigmoidoscopy May, 2010: Severe, left-sided predominately colitis with a transition at 30 cm. Also a tubular adenoma was removed.  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  June, 2010 increased azathioprine 150 mg a day.  October, 2010 increased azathioprine to 200 mg a day.  Stopped remicade, per patient request, and on solo azathiaprian October 2010.  December 2011, doing well on azathiaprine 2020ma day for over a year now. 2. Duodenal ulcer, EGD october 2009, in setting of NSAIDs/steroids.  Biopsies of stomach showed no H. pylori.  History of Present Illness Visit Type: Follow-up Visit Primary GI MD: DaOwens LofflerD Primary Provider: n/a Requesting Provider: n/a Chief Complaint: follow-up  History of Present Illness:     very pleasant [age]ear old man whom I last saw 8 months ago.  No bleeding, no abd pains.  Has one BM a day in AM.  Feels well, he is back in school for business degree.  he had a CBC and liver tests done one month ago and those labs were all good.           Current Medications (verified): 1)  Azathioprine 50 Mg Tabs (Azathioprine) .... Take 4 Pills A Day  Allergies (verified): No Known Drug Allergies  Vital Signs:  Patient profile:   [age]ear old male Height:      72 inches Weight:      187 pounds BMI:     25.45 Pulse rate:   68 / minute Pulse rhythm:    regular BP sitting:   108 / 68  (left arm)  Vitals Entered By: BaRandye LoboCMA (November 11, 2010 1:52 PM)  Physical Exam  Additional Exam:  Constitutional: generally well appearing Psychiatric: alert and oriented times 3 Abdomen: soft, non-tender, non-distended, normal bowel sounds    Impression & Recommendations:  Problem # 1:  pan colitis he is doing very well on azathioprine 200 mg a day for the past year or so now. His blood counts are stable. I've given him another prescription of it. He will return for CBC and liver tests in 3 months and he'll return to see me in about 6 months. He knows to call sooner if there are troubles.  Patient Instructions: 1)  CBC and cmet in 3 months. 2)  Return to see Dr. JaArdis Hughs months. 3)  The medication list was reviewed and reconciled.  All changed / newly prescribed medications were explained.  A complete medication list was provided to the patient / caregiver. Prescriptions: AZATHIOPRINE 50 MG TABS (AZATHIOPRINE) take 4 pills a day  #120 x 11   Entered and Authorized by:   DaMilus BanisterD   Signed by:   DaMilus BanisterD on 11/11/2010   Method used:   Electronically to        [facility] # [phone](retail)       [address]     [place]  [id number]  Ph: [phone]       Fax: [phone]   RxID:   [id number]

## 2010-12-24 NOTE — Progress Notes (Signed)
Summary: Med Refill   Phone Note Call from Patient Call back at Home Phone [phone]   Call For: Dr Ardis Hughs Reason for Call: Refill Medication Summary of Call: Azathioprine 11m, has no more refills [facility] [address]  Initial call taken by: YIrwin BrakemanPKendall [facility]  January 20, 2010 9:09 AM    Prescriptions: AZATHIOPRINE 50 MG TABS (AZATHIOPRINE) take 4 pills a day  #120 x 2   Entered by:   CAbel PrestoRN   Authorized by:   DMilus BanisterMD   Signed by:   CAbel PrestoRN on 01/20/2010   Method used:   Electronically to        [facility] # [phone] (retail)       [address]      [place]  [address]      Ph: [phone]      Fax: [phone]  RxID:   [id number]

## 2010-12-24 NOTE — Letter (Signed)
Summary: Office Visit Letter  [facility]  [address] [place], [place] [address]   Phone: [phone]  Fax: [phone]      January 16, 2010 MRN: [id number]   Stephen Henson [address] [place], [place]  [address]   Dear Stephen Henson,   According to our records, it is time for you to schedule a follow-up office visit with [place] in the month of April 2011.   At your convenience, please call [phone] (option #2)to schedule an office visit. If you have any questions, concerns, or feel that this letter is in error, we would appreciate your call.   Sincerely,  Milus Banister, M.D.  [facility] [phone]

## 2010-12-24 NOTE — Progress Notes (Signed)
Summary: Triage   Phone Note Call from Patient Call back at Home Phone [phone]   Caller: Patient Call For: Dr. Ardis Hughs Reason for Call: Talk to Nurse Summary of Call: Pt.'s urologist prescribed him Axiron and wants to know if it will interfere with any of his other meds. Initial call taken by: Webb Laws,  June 10, 2010 4:48 PM  Follow-up for Phone Call        should be OK with his azathiaprine Follow-up by: Milus Banister MD,  June 11, 2010 12:10 PM  Additional Follow-up for Phone Call Additional follow up Details #1::        pt aware Additional Follow-up by: Christian Mate CMA Deborra Medina),  June 11, 2010 12:26 PM

## 2010-12-24 NOTE — Progress Notes (Signed)
Summary: Lab and ROV reminder   Phone Note Outgoing Call   Call placed by: Christian Mate CMA Deborra Medina),  August 26, 2010 8:56 AM Summary of Call: pt called and reminded to have labs and schedule ROV. Initial call taken by: Christian Mate CMA Deborra Medina),  August 26, 2010 8:57 AM

## 2010-12-24 NOTE — Progress Notes (Signed)
Summary: Lab reminder   Phone Note Outgoing Call Call back at [facility] Phone [phone]   Call placed by: Christian Mate CMA Deborra Medina),  December 24, 2009 9:29 AM Summary of Call: Called to remind pt to have labs drawn.  He will have done this week. Initial call taken by: Christian Mate CMA Deborra Medina),  December 24, 2009 9:29 AM

## 2010-12-24 NOTE — Progress Notes (Signed)
Summary: triage   Phone Note Call from Patient Call back at Home Phone [phone]   Caller: Patient Call For: Stephen Henson Reason for Call: Talk to Nurse Summary of Call: Patient states that he has been sick and was given antibiotics but it's messing with his stomach, wants to know what to do. Initial call taken by: Ronalee Red,  December 30, 2009 9:24 AM  Follow-up for Phone Call         Pt. was given Levaquin 526m. q.d. for sinus infection which he started yesterday around noon.Last pm had loose stools x3 and x1 this am. No cramping and no blood or mucus seen.Is on 653m Follow-up by: ChAbel PrestoN,  December 30, 2009 9:46 AM  Additional Follow-up for Phone Call Additional follow up Details #1::        very unlikely to be c. diff infection this soon after first dose.  Would simply start one immodium  twice daily for now.  CAll if things worsen.  Additional Follow-up by: DaMilus BanisterD,  December 30, 2009 10:07 AM    Additional Follow-up for Phone Call Additional follow up Details #2::     Pt. informed of Dr. JaArdis Hughsorders. Follow-up by: ChAbel PrestoN,  December 30, 2009 10:10 AM

## 2011-01-25 ENCOUNTER — Other Ambulatory Visit: Payer: Self-pay

## 2011-01-26 ENCOUNTER — Telehealth (INDEPENDENT_AMBULATORY_CARE_PROVIDER_SITE_OTHER): Payer: Self-pay | Admitting: *Deleted

## 2011-02-02 NOTE — Progress Notes (Signed)
Summary: lab reminder   Phone Note Outgoing Call Call back at Home Phone [phone]   Call placed by: Christian Mate CMA Deborra Medina),  January 26, 2011 10:52 AM Summary of Call: pt called and reminded to have labs done Initial call taken by: Christian Mate CMA Deborra Medina),  January 26, 2011 10:52 AM

## 2011-02-16 ENCOUNTER — Other Ambulatory Visit (INDEPENDENT_AMBULATORY_CARE_PROVIDER_SITE_OTHER): Payer: BC Managed Care – PPO

## 2011-02-16 DIAGNOSIS — K5190 Ulcerative colitis, unspecified, without complications: Secondary | ICD-10-CM

## 2011-02-16 LAB — COMPREHENSIVE METABOLIC PANEL
ALT: 35 U/L (ref 0–53)
Alkaline Phosphatase: 61 U/L (ref 39–117)
Creatinine, Ser: 0.9 mg/dL (ref 0.4–1.5)
GFR: 100.04 mL/min (ref >60.00)
Glucose, Bld: 83 mg/dL (ref 70–99)
Sodium: 140 mEq/L (ref 135–145)
Total Bilirubin: 0.9 mg/dL (ref 0.3–1.2)
Total Protein: 6.2 g/dL (ref 6.0–8.3)

## 2011-02-16 LAB — CBC WITH DIFFERENTIAL/PLATELET
Basophils Absolute: 0 10*3/uL (ref 0.0–0.1)
HCT: 40.3 % (ref 39.0–52.0)
Hemoglobin: 13.9 g/dL (ref 13.0–17.0)
Lymphs Abs: 1.3 10*3/uL (ref 0.7–4.0)
MCV: 102.1 fl — ABNORMAL HIGH (ref 78.0–100.0)
Monocytes Absolute: 0.6 10*3/uL (ref 0.1–1.0)
Neutro Abs: 7 10*3/uL (ref 1.4–7.7)
Platelets: 241 10*3/uL (ref 150.0–400.0)
RDW: 15.4 % — ABNORMAL HIGH (ref 11.5–14.6)

## 2011-02-17 ENCOUNTER — Telehealth: Payer: Self-pay

## 2011-02-17 NOTE — Telephone Encounter (Signed)
Left message on machine to call back  

## 2011-02-17 NOTE — Telephone Encounter (Signed)
Message copied by Christian Mate on Wed Feb 17, 2011  9:20 AM ------      Message from: Owens Loffler      Created: Wed Feb 17, 2011  7:09 AM       Please call him.  CBC, cmet look good.  No changes in meds.  Needs repeat cbc/cmet in about 3 months and rov with me shortly afterwards

## 2011-03-02 LAB — DIFFERENTIAL
Basophils Absolute: 0.1 10*3/uL (ref 0.0–0.1)
Blasts: 0 %
Lymphocytes Relative: 7 % — ABNORMAL LOW (ref 12–46)
Lymphs Abs: 0.8 10*3/uL (ref 0.7–4.0)
Myelocytes: 0 %
Neutro Abs: 8.8 10*3/uL — ABNORMAL HIGH (ref 1.7–7.7)
Neutrophils Relative %: 78 % — ABNORMAL HIGH (ref 43–77)
Promyelocytes Absolute: 0 %
nRBC: 0 /100 WBC

## 2011-03-02 LAB — COMPREHENSIVE METABOLIC PANEL
AST: 14 U/L (ref 0–37)
Albumin: 2.5 g/dL — ABNORMAL LOW (ref 3.5–5.2)
Albumin: 3.1 g/dL — ABNORMAL LOW (ref 3.5–5.2)
Alkaline Phosphatase: 53 U/L (ref 39–117)
BUN: 12 mg/dL (ref 6–23)
Chloride: 100 mEq/L (ref 96–112)
Chloride: 96 mEq/L (ref 96–112)
Creatinine, Ser: 0.75 mg/dL (ref 0.4–1.5)
Creatinine, Ser: 0.82 mg/dL (ref 0.4–1.5)
GFR calc Af Amer: >60 mL/min (ref >60)
GFR calc non Af Amer: >60 mL/min (ref >60)
Glucose, Bld: 110 mg/dL — ABNORMAL HIGH (ref 70–99)
Potassium: 3.5 mEq/L (ref 3.5–5.1)
Potassium: 4.1 mEq/L (ref 3.5–5.1)
Sodium: 136 mEq/L (ref 135–145)
Total Bilirubin: 0.5 mg/dL (ref 0.3–1.2)
Total Bilirubin: 0.6 mg/dL (ref 0.3–1.2)

## 2011-03-02 LAB — CLOSTRIDIUM DIFFICILE EIA: C difficile Toxins A+B, EIA: NEGATIVE

## 2011-03-02 LAB — URINALYSIS, ROUTINE W REFLEX MICROSCOPIC
Bilirubin Urine: NEGATIVE
Hgb urine dipstick: NEGATIVE
Nitrite: NEGATIVE
Nitrite: NEGATIVE
Protein, ur: NEGATIVE mg/dL
Specific Gravity, Urine: 1.023 (ref 1.005–1.030)
Urobilinogen, UA: 0.2 mg/dL (ref 0.0–1.0)
pH: 6 (ref 5.0–8.0)

## 2011-03-02 LAB — CBC
HCT: 37.8 % — ABNORMAL LOW (ref 39.0–52.0)
HCT: 41.7 % (ref 39.0–52.0)
Hemoglobin: 12.7 g/dL — ABNORMAL LOW (ref 13.0–17.0)
Hemoglobin: 13.9 g/dL (ref 13.0–17.0)
MCV: 92.2 fL (ref 78.0–100.0)
MCV: 92.3 fL (ref 78.0–100.0)
Platelets: 274 10*3/uL (ref 150–400)
Platelets: 294 10*3/uL (ref 150–400)
RBC: 4.1 MIL/uL — ABNORMAL LOW (ref 4.22–5.81)
WBC: 11.3 10*3/uL — ABNORMAL HIGH (ref 4.0–10.5)
WBC: 9 10*3/uL (ref 4.0–10.5)

## 2011-03-02 LAB — GLUCOSE, CAPILLARY
Glucose-Capillary: 128 mg/dL — ABNORMAL HIGH (ref 70–99)
Glucose-Capillary: 146 mg/dL — ABNORMAL HIGH (ref 70–99)

## 2011-04-06 NOTE — Assessment & Plan Note (Signed)
[facility] OFFICE NOTE   Stephen Henson                     MRN:          [id number]  DATE:10/02/2007                            DOB:          Feb 29, 1971    GI PROBLEM LIST:  Mild proctitis.  He had intermittent rectal bleeding  2006-2007 within 2-3 months after stopping smoking.  Colonoscopy March  2007 showed mild inflammation up to 15 cm.  Biopsies proved chronic  active colitis, responded very well to daily Canasa suppositories.   INTERVAL HISTORY:  I last saw Stephen Henson myself a year and a half ago.  About 3 months ago, he had an acute diarrheal illness that eventually  became bloody.  He was traveling a lot and thought her may have caught  something.  He was evaluated here by Nicoletta Ba, who thought that he  may be having a flare of his proctitis.  She started him on Asacol 400,  3 pills t.i.d., and restarted his Canasa suppositories.  He was going to  return to see me about 2 weeks after that, but he did not until now,  which is about 6 weeks after that visit.  He has definitely noticed a  vast improvement in his symptoms.  He is down to taking Canasa  suppositories once a day.  He is taking the Asacol approximately twice a  day.  He wants to come off of medicines as soon as possible, if that is  possible.   CURRENT MEDICATIONS:  1. Asacol 400 mg t.i.d.  2. Canasa suppositories 1 g daily.  3. Beta complex.  4. Garlic.  5. Vitamin C.  6. Multivitamin.   PHYSICAL EXAMINATION:  VITAL SIGNS:  Weight 197 pounds, blood pressure  132/86, pulse 96.  CONSTITUTIONAL:  Generally well appearing.  ABDOMEN:  Soft, nontender, nondistended.  Normal bowel sounds.  EXTREMITIES:  No lower extremity edema.   ASSESSMENT AND PLAN:  A 40 year old man with history of proctitis,  possibly recent flare.   The acute onset of his symptoms does argue somewhat against a flare of  colitis, as that usually has a  bit more of an insidious onset.  He was  told by some other Dortha Neighbors that he had a viral gastroenteritis, and  that may indeed be the case.  Either way, he wants to come off his  medicines.  He was drug free for about a year and would like to be off  medicines again as soon as possible, so I recommended that he stop the  Asacol for now and continue the suppository.  He will return to see me  in 6 weeks, and sooner if needed.    Milus Banister, MD  Electronically Signed   DPJ/MedQ  DD: 10/02/2007  DT: 10/03/2007  Job #: [id number]

## 2011-04-06 NOTE — Op Note (Signed)
NAME:  ATTHEW, COUTANT                ACCOUNT NO.:  [id number]   MEDICAL RECORD NO.:  [id number]          PATIENT TYPE:  AMB   LOCATION:  DAY                          FACILITY:  [facility]   PHYSICIAN:  Susa Day, M.D.    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   PROCEDURE:  Lateral recess decompression, microdiskectomy, foraminotomy,  hemilaminotomy L4-5, right.   ANESTHESIA:  General.   ASSISTANT:  Rometta Emery, PA.   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  Refractory to conserve treatment including ___selective_______  nerve  root block, etc. He is indicated for a decompression of the 5 root.  The  risks and benefits discussed including bleeding, infection, injury to  vascular structures, CSF leakage, epidural fibrosis, adjacent segment  disease, need for fusion in the future, anesthetic complications, etc.   TECHNIQUE:  The patient in supine position and after an adequate level  of general anesthesia and 1 gram Kefzol, he was placed prone on [facility]  frame, all bony prominences well padded.  The lumbar region was prepped  and draped in the usual sterile fashion.  Two 18-gauge spinal needles  were utilized to localize the 4-5 interspace confirmed with x-ray. An  incision was made from the spinous process of 4 to 5, subcutaneous  tissue was dissected, electrocautery was utilized to achieve hemostasis.  The paraspinous muscle was elevated from the lamina of 4 and 5 on the  right, McCullough retractor was placed.  A Penfield four in the  interlaminar space, x-ray confirmed the 4-5 space.  The operating  microscope  draped and brought on the surgical field.   Hemilaminotomy of the caudad edge of 4 was performed with a 2 mm  Kerrison, high speed bur. Detached the ligamentum flavum. In a  similar  fashion, the ligamentum flavum was detached from the cephalad edge of 5  utilizing the straight curette. Foraminotomy of 5 was performed.  Ligamentum flavum removed from the interspace after a neural patty was  placed in the ligamentum flavum and the neural elements.  Severe  stenosis noted here secondary to ligamentum flavum hypertrophy.  The  lateral recess decompressed in the medial border of the pedicle.  There  was a hypervascular epidural venous plexus which was cauterized. The  nerve root was adhesed into the lateral recess, gently mobilized  medially.  A focal HNP was then identified and annulotomy was performed.  A copious portion of disk material was removed from the disk space with  straight and upbiting pituitaries, further mobilized with an Epstein.  We had full diskectomy of herniated material. There was good excursion  of the 5 root, 1 cm medial to pedicle without difficulty.  We checked  the axilla of the 5 root, the shoulder of the 5  root out the foramen of  4 and 5 and there was no residual disk herniation compressing the root.  The disk space was copiously irrigated with antibiotic irrigation by  Angiocath. Inspection reveals no evidence of  CSF leakage or active  bleeding.  We had seen the remnants of a selective nerve root block  there as well.   Next, I placed thrombin soaked Gelfoam in the laminotomy defect,  McCullough retractors removed.  The paraspinous muscle was irrigated and  inspected with no evidence of active bleeding.  The dorsolumbar fascia  was reapproximated with #0 Vicryl simple sutures, subcutaneous tissue  reapproximated with 2-0 Vicryl simple sutures, the skin was  reapproximated with 4-0 subcuticular Prolene.  The wound was reinforced  with Steri-Strips, sterile  dressing applied.  Placed supine on the  hospital bed, extubated without difficulty and transported to the  recovery room in satisfactory condition.   The patient tolerated the procedure well with no complication. Minimal  blood loss.      Susa Day, M.D.  Electronically Signed     JB/MEDQ  D:  02/15/2008  T:  02/16/2008  Job:  [id number]

## 2011-04-06 NOTE — H&P (Signed)
NAME:  Stephen Henson, Stephen Henson NO.:  [id number]   MEDICAL RECORD NO.:  [id number]          PATIENT TYPE:  INP   LOCATION:  1536                         FACILITY:  [facility]   PHYSICIAN:  Stephen Henson. Olevia Perches, MD     DATE OF BIRTH:  [birth year]   DATE OF ADMISSION:  04/09/2009  DATE OF DISCHARGE:                              HISTORY & PHYSICAL   CHIEF COMPLAINT:  Diarrhea, crampy abdominal pain and bloody stools.   HISTORY:  Stephen Henson is a 40 year old white male known to Dr. Oretha Henson  with a history of pan ulcerative colitis who has been having progressive  symptoms since mid April 2010.  He has been on prednisone since that  time.  He says he initially started having diarrhea with 6-7 bowel  movements per day with associated bright red blood and mucus.  Over the  past 1 week his symptoms have progressed and he had gotten to the point  of having 15-20 bowel movements per day and now says over the past 24  hours he is having 20 bowel movements per day.  He is passing a lot of  mucus and blood with each bowel movement and says that his stools are  very foul smelling.  He is actually wearing a Depends at the time of  this interview because of incontinence with urgency.  He also complains  of increased abdominal cramping.  He had some fever at home, 101 to  101.9, intermittently over the past 4 days.  He says he was aching in  all of his joints this past weekend as well.  He has not had any  associated nausea or vomiting, his appetite has been decreased and his  weight is down about 6 pounds over the past week.  At this time he is  also complaining of difficulty urinating, says he has to stand to  urinate and wait for a urinary stream.  Denies any dysuria, has not had  any history of bladder issues.  He is not on any current  anticholinergics.  He was seen in the office on May 18th, per Dr.  Ardis Henson, his prednisone was increased to 40 mg daily, and today he called  the office with urinary  retention type symptoms and was referred to the  ER.  CT scan done via the ER shows a small hepatic cyst and a mildly  distended bladder, otherwise unremarkable exam.  WBC of 11.3, hemoglobin  13.9, hematocrit of 41.7, platelets 274.  Electrolytes within normal  limits.  BUN 12, creatinine 0.82.  LFTs have been within normal limits.  Albumin 3, lipase of 13.  We are called and at this time he is admitted  with a colitis exacerbation refractory to outpatient management.  It was  felt that he is probably having urinary symptoms secondary to  rectosigmoid edema, perhaps some underlying prostatitis.   CURRENT MEDICATIONS:  1. Prednisone 40 p.o. daily.  2. Asacol HD 800 mg 6 tablets daily.  3. Canasa was switched to Cortenemas at bedtime which he has not been      doing as  yet because he cannot retain them.  4. Vitamin C.  5. Milk Thistle.  6. Cranberry supplements.   ALLERGIES:  No known drug allergies.   FAMILY HISTORY:  Negative for GI disease.   SOCIAL HISTORY:  Patient is married.  He is in business.  He and his  wife do not have any children.  No tobacco, no EtOH.   REVIEW OF SYSTEMS:  GENERAL:  Pertinent for weakness and weight loss.  CARDIOVASCULAR:  Denies any chest pain or anginal symptoms.  PULMONARY:  Negative for cough, shortness of breath, sputum production.  GU/GI:  As  above.  MUSCULOSKELETAL:  Pertinent for recent joint aches in all of his  large joints which have improved over the past 2 days.  NEURO/PSYCH:  Denies.  All other review of systems negative.   PHYSICAL EXAMINATION:  A well-developed young white male in no acute  distress, alert and oriented x3.  Temp is 98.5, blood pressure 119/60,  pulse in the 90s.  HEENT:  Nontraumatic, normocephalic.  EOMI, PERRLA.  Sclerae anicteric.  NECK:  Supple.  There is no JVD.  CARDIOVASCULAR:  Regular rate and rhythm with S1 and S2.  No murmur, rub  or gallop.  PULMONARY:  Clear to A and P.  ABDOMEN:  Soft and nontender.   There is no palpable mass or  hepatosplenomegaly.  Bowel sounds are active.  RECTAL EXAM:  Not done at the time of admission.  EXTREMITIES:  Without clubbing, cyanosis or edema.  SKIN:  There is no rash or lesions noted.  NEURO:  Patient is alert and oriented x3 and exam is grossly nonfocal.   IMPRESSION:  21. [name] with known pan ulcerative colitis with      exacerbation now refractory to outpatient management.  Rule out      superimposed infectious colitis, that is Clostridium difficile.  2. Fever secondary to above.  3. Urinary hesitancy.  It is not clear at this time whether this is      all secondary to edema from his colitis or whether he has another      separate urologic issue.   PLAN:  The patient is admitted to the service of Dr. Delfin Henson for IV  fluid hydration, bowel rest.  He will be covered with IV Solu-Medrol 60  mg daily, IV Flagyl.  Stool cultures have been done and are pending at  the office.  Will check post void residuals and obtain urology  consultation if his symptoms persist.  For details, please see the  orders.      Stephen Lake McMurray, PA-C      Stephen M. Olevia Perches, MD  Electronically Signed    AE/MEDQ  D:  04/13/2009  T:  04/13/2009  Job:  [id number]

## 2011-04-06 NOTE — Discharge Summary (Signed)
NAME:  Stephen Henson, Stephen Henson NO.:  [id number]   MEDICAL RECORD NO.:  [id number]          PATIENT TYPE:  INP   LOCATION:  [place]                         FACILITY:  [facility]   PHYSICIAN:  Gatha Mayer, MD,FACGDATE OF BIRTH:  [birth year]   DATE OF ADMISSION:  04/09/2009  DATE OF DISCHARGE:  04/18/2009                               DISCHARGE SUMMARY   DISPOSITION:  Home in stable condition.   DISCHARGE MEDICATIONS:  1. Continue home Asacol HD 800 mg 2 pills t.i.d.  2. Canasa suppositories 1000 mg b.i.d.  3. Prednisone 40 mg daily.  4. Bentyl 10 mg b.i.d.  5. Flomax 0.4 mg daily.  6. Morphine 15 mg every 4-6 hours as needed.  7. Zofran 4 mg 1 every 6 hours as needed for nausea.   CONSULTATIONS:  Urology was consulted for urinary hesitancy.   PROCEDURE:  Flexible sigmoidoscopy with biopsies.   ADMITTING DIAGNOSES:  1. Pan-ulcerative colitis refractory to outpatient management.  2. Fever.  3. Urinary hesitancy.   HOSPITAL COURSE:  Stephen Henson was admitted to [facility] on Apr 09, 2009, by  Dr. Delfin Edis after being seen in our office for increase in bloody  diarrhea and abdominal pain.  The patient is actually followed by Dr.  Ardis Hughs in our office but was seen in the office by Dr. Olevia Perches on the day  of his admission.  In the hospital, the patient was started on IV Solu-  Medrol 30 mg every 12 hours.  He was given Flagyl to treat for any  empirical superimposed infections.  The patient was maintained on home  Asacol and was started on Cortenemas twice daily.  Stool C&S, O&P, and  C. difficile x3 were all negative.  Urology was consulted for persistent  urinary retention, and the patient was started on Flomax.  Voiding  difficulties were felt to be likely secondary to associated edema and  inflammation of prostate and bladder neck related to UC flare.  The  patient's urinary retention did respond to Flomax, his postvoid  residuals were satisfactory.  After several days of  steroids and Flagyl,  the patient's UC symptoms were not any better.  He underwent a flexible  sigmoidoscopy on Apr 14, 2009, which showed severe proctosigmoiditis  with edema, erythema, ulcerations, and mucoid discharge.  Findings  extended to 30 cm, then colon was normal.  Colon biopsy showed chronic  active colitis with focal ulceration and surface exudate.  No dysplasia  was seen.  A sessile polyp that was removed in the descending colon  turned out to be a tubular adenoma.  Thinking ahead about the  possibilities of the biologics, a PPD was administered and negative.  On  the sixth day of admission, the patient was overall feeling better with  less pain and less urgency.  He was eating 100% of his low-residue diet.  No fevers.  The plan at that point was to discharge the patient home  sometime in the next day or two.  [name] continued to show  improvement in his symptoms.  He was eating, ambulating in the halls,  and in pretty good spirits.  The bowel movements were still bloody, but  less frequent.  The patient was only requiring pain medications about  every 12 hours.  On Apr 17, 2009, the Flagyl was discontinued.  The  patient's Solu-Medrol was changed to prednisone 40 mg.  Canasa  suppositories were restarted (Cortenema had been discharge had been  discontinued earlier in the hospital course secondary to the patient's  inability to retain them).  On the day of discharge, Apr 18, 2009, the  patient was still having some cramps, nausea, and bloody stool.  He  looked okay, vital signs were stable, and abdomen was benign.  The  patient still desired to go home as he was no longer on any IV  medications and could manage his symptoms in the comfort of his own  home.  He was discharged home with his wife in stable condition.  The  patient was given a hospital followup appointment for next week.  He  would call our office in the interim for any questions or problems.   DISCHARGE  DIAGNOSES:  1. Severe proctosigmoiditis with improvement after several days of IV      steroids.  2. Negative PPD.  3. Fevers, resolved.  4. Urinary retention felt to be secondary to inflammation from      ulcerative colitis.  Symptoms responded to Flomax prescribed by      Urology.      Tye Savoy, NP      Gatha Mayer, MD,FACG  Electronically Signed    PG/MEDQ  D:  04/18/2009  T:  04/18/2009  Job:  [id number]

## 2011-04-06 NOTE — Assessment & Plan Note (Signed)
[place]                         GASTROENTEROLOGY OFFICE NOTE   MONTEY, EBEL                    MRN:          [id number]  DATE:08/11/2007                            DOB:          [birth year]    REFERRING PHYSICIAN:  Sandy Salaam. Deatra Ina, MD,FACG   PROBLEM:  Diarrhea and rectal bleeding.   HISTORY:  The patient is a very nice 40 year old white male known to Dr.  Ardis Hughs who has a history of mild ulcerative colitis. This has been  manifested with proctitis to 15 cm of the anus thus far. He has been  maintained on Canasa suppositories which he had gradually discontinued  over the past three months, which he had gradually discontinued and has  not been on any medications over the past year. He says that he has been  doing well without any symptoms and then started acutely this past  Sunday after a trip to [place], which he does frequently, he  developed diarrhea 3-4 times daily. Eventually he started seeing blood  mixed in with the bowel movements. He says at this point it has been  progressive and he is actually seeing more blood than diarrhea. He says  that if he does not eat, his symptoms are significantly decreased and  therefore he has been eating less and eating low fiber. He had been  having some ongoing abdominal cramping which he says has been better  over the past couple of days and initially had some nausea, which has  also resolved and never had any vomiting. He has had one low grade temp  at home. He says overall he feels okay. He does not feel particularly  fatigued or ill. He denies any recent antibiotics and has no other known  exposures. He had been seen at an urgent care on 09/18 and had stool  cultures ordered. We have now received results of those. The stool for  O&P is negative, stool culture is negative, Campylobacter was negative,  E-coli was negative and stool for WBC was positive. He was given a  prescription for  mesalamine which he has not filled yet. He says he  wanted to be seen here before changing any medications. He also had a  CBC done which showed a WBC of 7.1, hemoglobin 14.1, hematocrit of 40.7  and MCV of 88.   CURRENT MEDICATIONS:  1. Multivitamin daily including vitamin C and E supplements.  2. Garlic supplement.  3. B-complex.   ALLERGIES:  No known drug allergies.   PHYSICAL EXAMINATION:  Well-developed, healthy-appearing white male in  no acute distress. Weight is 190.2, blood pressure 108/68, pulse 72.  Temperature is 97.9.  HEENT: Nontraumatic, normocephalic. EOMI. PERRLA. Sclerae anicteric.  CARDIOVASCULAR: Regular rate and rhythm with S1 and S2.  PULMONARY: Clear to A&P.  ABDOMEN: Soft and nontender. There is no palpable mass or  hepatosplenomegaly. No guarding or rebound.  RECTAL: Was not done today.   IMPRESSION:  A 40 year old male with known ulcerative proctitis with  exacerbation. Difficult to say whether this may have been triggered by a  gastroenteritis but sounds more like a colitis flare at  this time.   PLAN:  1. Start Asacol 400 mg three p.o. t.i.d.  2. Start Canasa suppositories one per rectum b.i.d. x1 week and then      if symptoms are decreasing change to nightly until followup visit      with Dr.  Ardis Hughs.  3. Return office visit with Dr.  Ardis Hughs in 2-3 weeks or sooner p.r.n.      Nicoletta Ba, PA-C  Electronically Signed      Sandy Salaam. Deatra Ina, MD,FACG  Electronically Signed   AE/MedQ  DD: 08/14/2007  DT: 08/14/2007  Job #: [id number]

## 2011-04-06 NOTE — Consult Note (Signed)
NAME:  [name], [name] NO.:  [id number]   MEDICAL RECORD NO.:  [id number]          PATIENT TYPE:  INP   LOCATION:  [place]                         FACILITY:  [facility]   PHYSICIAN:  Raynelle Bring, MD      DATE OF BIRTH:  [birth year]   DATE OF CONSULTATION:  04/13/2009  DATE OF DISCHARGE:                                 CONSULTATION   REASON FOR CONSULTATION:  Lower urinary tract symptoms.   HISTORY:  Stephen Henson is a 40 year old patient with a history of ulcerative  colitis who has been admitted by Dr. Delfin Edis for IV steroid  management of an acute flare for his ulcerative colitis.  He is seen in  consultation today at the request of Dr. Olevia Perches due to voiding symptoms  which began about 1 week ago.  He began noticing an inability to void  when having bowel movements.  Subsequently, he had difficulty voiding  even when he had an urge to just urinate.  Specifically, his symptoms  included mild to moderate hesitancy, a split stream, and a weak stream.  He did feel like he was able to empty fairly well.  However,  approximately 4 days ago he presented to the emergency department with  worsening pain.  Most of his pain symptoms were related to crampy  abdominal pain and rectal pain related to his ulcerative colitis.  However, he was then unable to void at all despite an intense urge at  that time.  An attempt was made to place a catheter which was  unsuccessful and possibly related to the his urethral sphincter being  tight.  After a couple of attempts at catheter placement, the catheter  was removed and the patient was able to void with a good strong stream  and felt like he voided to completion.  During his hospitalization, his  symptoms have been intermittent and have fluctuated.  His postvoid  residuals have ranged between 30 mL and 200 mL.  He denies any prior  history of voiding or storage urinary symptoms.   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  He specifically denies any dysuria,  hematuria, history of urinary tract infections, urolithiasis, or GU  malignancy.  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  However, this was over 1 year ago and he has not noted any  difficulties voiding between his surgery and 1 week ago.   PAST MEDICAL HISTORY:  1. Ulcerative colitis.  2. Back pain.   PAST SURGICAL HISTORY:  As stated above.   MEDICATIONS:  1. Asacol.  2. Canasa.   ALLERGIES:  NO KNOWN DRUG ALLERGIES.   FAMILY HISTORY:  No GU malignancy or urolithiasis.  His mother does have  recurrent urinary tract infections.   SOCIAL HISTORY:  The patient is married.  He denies tobacco or excessive  alcohol use.   REVIEW OF SYSTEMS:  All systems are reviewed and are negative except as  in the history of present illness.   PHYSICAL EXAM:  VITALS:  Temperature 98.5, heart rate 69, respirations  18, blood pressure 111/61.  CONSTITUTIONAL:  He is a well-nourished, age appropriate male in no  acute distress.  HEENT:  Normocephalic/atraumatic.  NECK:  No JVD.  RESPIRATORY:  Normal respiratory effort.  ABDOMEN:  Soft and nondistended without abdominal masses.  BACK:  No CVA tenderness.  GU:  Normal male phallus without urethral discharge.  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  DRE:  He does have a mildly lax sphincter tone.  He has significant  edema of the rectal wall with tenderness on exam.  Due to the edema, his  prostate is not well palpated.  EXTREMITIES:  No edema.  NEUROLOGIC:  Grossly intact.   LABORATORY EVALUATION:  Urinalysis is dipstick negative.  White blood  count 10.5.   Postvoid residual urines have been between 30 and 200 mL as stated  above.   IMPRESSION:  Lower urinary tract symptoms likely related to edema and  inflammation associated with his ulcerative colitis.   PLAN:  I have recommended continuing  to check post void residual urines  to ensure that he is not developing urinary retention.  He will be  empirically started on tamsulosin 0.4 mg p.o. nightly to see if this  will help him to void more easily.  I believe that his symptoms are  likely transient and will improve with time.  If he does develop urinary  retention, he will require a Foley catheter.      Raynelle Bring, MD  Electronically Signed     LB/MEDQ  D:  04/13/2009  T:  04/14/2009  Job:  [id number]   cc:   Lowella Bandy. Olevia Perches, [place]  [place] [address]

## 2011-04-26 ENCOUNTER — Other Ambulatory Visit: Payer: BC Managed Care – PPO

## 2011-04-26 ENCOUNTER — Other Ambulatory Visit: Payer: Self-pay | Admitting: Gastroenterology

## 2011-04-26 ENCOUNTER — Other Ambulatory Visit (INDEPENDENT_AMBULATORY_CARE_PROVIDER_SITE_OTHER): Payer: BC Managed Care – PPO

## 2011-04-26 ENCOUNTER — Telehealth: Payer: Self-pay

## 2011-04-26 DIAGNOSIS — K5190 Ulcerative colitis, unspecified, without complications: Secondary | ICD-10-CM

## 2011-04-26 LAB — CBC WITH DIFFERENTIAL/PLATELET
Basophils Relative: 0.3 % (ref 0.0–3.0)
Eosinophils Relative: 0.6 % (ref 0.0–5.0)
HCT: 37.4 % — ABNORMAL LOW (ref 39.0–52.0)
Lymphs Abs: 1.3 10*3/uL (ref 0.7–4.0)
MCV: 101.9 fl — ABNORMAL HIGH (ref 78.0–100.0)
Monocytes Absolute: 0.4 10*3/uL (ref 0.1–1.0)
Platelets: 224 10*3/uL (ref 150.0–400.0)
WBC: 8.3 10*3/uL (ref 4.5–10.5)

## 2011-04-26 LAB — COMPREHENSIVE METABOLIC PANEL
Alkaline Phosphatase: 49 U/L (ref 39–117)
BUN: 22 mg/dL (ref 6–23)
Creatinine, Ser: 1 mg/dL (ref 0.4–1.5)
Glucose, Bld: 94 mg/dL (ref 70–99)
Sodium: 136 mEq/L (ref 135–145)
Total Bilirubin: 1.3 mg/dL — ABNORMAL HIGH (ref 0.3–1.2)

## 2011-04-26 NOTE — Telephone Encounter (Signed)
Message copied by Barron Alvine on Mon Apr 26, 2011 10:09 AM ------      Message from: Barron Alvine      Created: Wed Feb 17, 2011 11:41 AM       Pt to get labs

## 2011-04-26 NOTE — Telephone Encounter (Signed)
Pt reminded to have labs today

## 2011-04-27 ENCOUNTER — Encounter: Payer: Self-pay | Admitting: Gastroenterology

## 2011-04-27 ENCOUNTER — Ambulatory Visit (INDEPENDENT_AMBULATORY_CARE_PROVIDER_SITE_OTHER): Payer: BC Managed Care – PPO | Admitting: Gastroenterology

## 2011-04-27 VITALS — BP 104/62 | HR 84 | Ht 71.0 in | Wt 186.6 lb

## 2011-04-27 DIAGNOSIS — K529 Noninfective gastroenteritis and colitis, unspecified: Secondary | ICD-10-CM

## 2011-04-27 DIAGNOSIS — K5289 Other specified noninfective gastroenteritis and colitis: Secondary | ICD-10-CM

## 2011-04-27 NOTE — Progress Notes (Signed)
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 Colonoscopy March 2007 showed mild inflammation up to 15 cm. Biopsies proved chronic active colitis, responded very well to daily Canasa suppositories. Repeat colonoscopy October 2009 during new flare showed moderate pan colitis. Responded to steroids quickly. He had not been taking asacol except for intermittent minor flares. Flex sigmoidoscopy May, 2010: Severe, left-sided predominately colitis with a transition at 30 cm. Also a tubular adenoma was removed. 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 June, 2010 increased azathioprine 150 mg a day. October, 2010 increased azathioprine to 200 mg a day. Stopped remicade, per patient request, and on solo azathiaprian October 2010. December 2011, doing well on azathiaprine 2017ma day for over a year now. February, 2012 doing well on azathioprine 200 mg a day. 2. Duodenal ulcer, EGD october 2009, in setting of NSAIDs/steroids. Biopsies of stomach showed no H. pylori.   HPI: This is a very pleasant [age]ear old with pan ulcerative colitis that has been in remission clinically for at least a year now. He had lab tests drawn today which were at his usual, macrocytic, not anemic. His liver tests were essentially normal except for a total bilirubin that was slightly above normal.  He's been doing very well. Normal.  Has 1 BM a day, solid, non-bloody.  Smokes 5 cigarrettes a day, uses this to help with his colitis symptoms.    Works out a [employer] recently, has helped since he strated testosterone.       Physical Exam: BP 104/62  Pulse 84  Ht 5' 11"  (1.803 m)  Wt 186 lb 9.6 oz (84.641 kg)  BMI 26.03 kg/m2 Constitutional: generally well-appearing Psychiatric:  alert and oriented x3 Abdomen: soft, nontender, nondistended, no obvious ascites, no peritoneal signs, normal bowel sounds    Assessment and plan: [age].o. male with pan colitis  Has been in remission for over a year now.  Will stay on azathiaprine 20073m day for now.   She will have a repeat set of blood work including a CBC, complete metabolic profile in 3 months and he will return to see me in 6 months, sooner if needed.

## 2011-04-27 NOTE — Patient Instructions (Signed)
Repeat CBC, cmet in 3 months. Return to see Dr. Ardis Hughs in 6 months, sooner if needed.

## 2011-07-28 ENCOUNTER — Telehealth: Payer: Self-pay

## 2011-07-28 NOTE — Telephone Encounter (Signed)
Message copied by Barron Alvine on Wed Jul 28, 2011  9:46 AM ------      Message from: Barron Alvine      Created: Tue Apr 27, 2011  2:13 PM       PT TO GET LABS

## 2011-07-28 NOTE — Telephone Encounter (Signed)
Pt called and reminded to have labs

## 2011-08-16 LAB — COMPREHENSIVE METABOLIC PANEL
ALT: 32
Albumin: 3.8
Alkaline Phosphatase: 46
BUN: 14
Chloride: 105
Glucose, Bld: 107 — ABNORMAL HIGH
Potassium: 4.1
Sodium: 139
Total Bilirubin: 0.8

## 2011-08-16 LAB — URINALYSIS, ROUTINE W REFLEX MICROSCOPIC
Glucose, UA: NEGATIVE
Specific Gravity, Urine: 1.018
pH: 5.5

## 2011-08-16 LAB — CBC
HCT: 39.4
Hemoglobin: 13.8
Platelets: 262
WBC: 7.2

## 2011-08-16 LAB — APTT: aPTT: 29

## 2011-08-16 LAB — DIFFERENTIAL
Basophils Absolute: 0
Basophils Relative: 1
Eosinophils Absolute: 0.1
Monocytes Absolute: 0.6
Neutro Abs: 4.8

## 2011-08-23 LAB — URINALYSIS, ROUTINE W REFLEX MICROSCOPIC
Glucose, UA: NEGATIVE
Hgb urine dipstick: NEGATIVE
Nitrite: NEGATIVE
Specific Gravity, Urine: 1.026
pH: 5.5

## 2011-08-23 LAB — CBC
HCT: 38.8 — ABNORMAL LOW
Hemoglobin: 13.3
MCHC: 34.2
MCV: 87.6
RBC: 4.43
WBC: 18.7 — ABNORMAL HIGH

## 2011-08-23 LAB — DIFFERENTIAL
Basophils Relative: 1
Eosinophils Absolute: 0.1
Eosinophils Relative: 1
Lymphs Abs: 1.7
Monocytes Absolute: 1.3 — ABNORMAL HIGH
Monocytes Relative: 7

## 2011-08-23 LAB — POCT I-STAT, CHEM 8
BUN: 4 — ABNORMAL LOW
Calcium, Ion: 1.1 — ABNORMAL LOW
Glucose, Bld: 124 — ABNORMAL HIGH
TCO2: 27

## 2011-09-21 ENCOUNTER — Other Ambulatory Visit (INDEPENDENT_AMBULATORY_CARE_PROVIDER_SITE_OTHER): Payer: BC Managed Care – PPO

## 2011-09-21 DIAGNOSIS — K529 Noninfective gastroenteritis and colitis, unspecified: Secondary | ICD-10-CM

## 2011-09-21 DIAGNOSIS — K5289 Other specified noninfective gastroenteritis and colitis: Secondary | ICD-10-CM

## 2011-09-21 LAB — CBC WITH DIFFERENTIAL/PLATELET
Basophils Absolute: 0 10*3/uL (ref 0.0–0.1)
Eosinophils Absolute: 0.1 10*3/uL (ref 0.0–0.7)
HCT: 41.2 % (ref 39.0–52.0)
Lymphs Abs: 0.9 10*3/uL (ref 0.7–4.0)
MCHC: 34.2 g/dL (ref 30.0–36.0)
MCV: 108.1 fl — ABNORMAL HIGH (ref 78.0–100.0)
Monocytes Absolute: 0.5 10*3/uL (ref 0.1–1.0)
Neutrophils Relative %: 77.8 % — ABNORMAL HIGH (ref 43.0–77.0)
Platelets: 218 10*3/uL (ref 150.0–400.0)
RDW: 17.8 % — ABNORMAL HIGH (ref 11.5–14.6)
WBC: 6.6 10*3/uL (ref 4.5–10.5)

## 2011-09-21 LAB — COMPREHENSIVE METABOLIC PANEL
ALT: 26 U/L (ref 0–53)
Calcium: 8.8 mg/dL (ref 8.4–10.5)
GFR: 99.73 mL/min (ref >60.00)
Potassium: 4.3 mEq/L (ref 3.5–5.1)
Sodium: 141 mEq/L (ref 135–145)

## 2011-11-03 ENCOUNTER — Encounter: Payer: Self-pay | Admitting: Gastroenterology

## 2011-11-03 ENCOUNTER — Ambulatory Visit (INDEPENDENT_AMBULATORY_CARE_PROVIDER_SITE_OTHER): Payer: BC Managed Care – PPO | Admitting: Gastroenterology

## 2011-11-03 VITALS — BP 112/78 | HR 72 | Ht 72.0 in | Wt 194.4 lb

## 2011-11-03 DIAGNOSIS — K5190 Ulcerative colitis, unspecified, without complications: Secondary | ICD-10-CM

## 2011-11-03 MED ORDER — AZATHIOPRINE 50 MG PO TABS: 200.0000 mg | ORAL_TABLET | Freq: Every day | ORAL | Status: DC

## 2011-11-03 NOTE — Patient Instructions (Signed)
Continue azathiaprine 26m once daily. CBC, CMET the end of April 2013. Return to see Dr. JArdis Hughsin 6 months, sooner if needed.

## 2011-11-03 NOTE — Progress Notes (Signed)
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 Colonoscopy March 2007 showed mild inflammation up to 15 cm. Biopsies proved chronic active colitis, responded very well to daily Canasa suppositories. Repeat colonoscopy October 2009 during new flare showed moderate pan colitis. Responded to steroids quickly. He had not been taking asacol except for intermittent minor flares. Flex sigmoidoscopy May, 2010: Severe, left-sided predominately colitis with a transition at 30 cm. Also a tubular adenoma was removed. 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 June, 2010 increased azathioprine 150 mg a day. October, 2010 increased azathioprine to 200 mg a day. Stopped remicade, per patient request, and on solo azathiaprian October 2010. December 2011, doing well on azathiaprine 2052ma day for over a year now. February, 2012 doing well on azathioprine 200 mg a day.  June 2012 continues to do well on 20032mzathiaprine a day. Labs 10/12 show normal LFTs, normal WBC, Hb (MCV 108). 2. Duodenal ulcer, EGD october 2009, in setting of NSAIDs/steroids. Biopsies of stomach showed no H. pylori.  HPI: This is a very pleasant [age]ar old man whom I last saw about 6 months ago. He continues to do well on azathioprine 200 mg once daily. Most recent CBC and complete metabolic profile looked great.  He has been fine.  No bleeding, no diarrhea.  No significant abd pains.  CAn have a very brief shooting pain (last seconds only, occurs every few months).    Past Medical History  Diagnosis Date  . Colitis     PAN  . Duodenal ulcer   . Testicular disorder   . Proctitis   . Heartburn   . Abnormal LFTs     History reviewed. No pertinent past surgical history.  Current Outpatient Prescriptions    Medication Sig Dispense Refill  . azaTHIOprine (IMURAN) 50 MG tablet Take 50 mg by mouth. 4 pills once daily        . testosterone (ANDROGEL) 50 MG/5GM GEL Place 1.62 g onto the skin daily.        . Testosterone (AXIRON) 30 MG/ACT SOLN Place onto the skin daily.          Allergies as of 11/03/2011  . (No Known Allergies)    Family History  Problem Relation Age of Onset  . Diabetes Mother   . COPD Father     History   Social History  . Marital Status: Married    Spouse Name: N/A    Number of Children: N/A  . Years of Education: N/A   Occupational History  . MarRadiographer, therapeutic Social History Main Topics  . Smoking status: Current Everyday Smoker  . Smokeless tobacco: Never Used  . Alcohol Use: Yes  . Drug Use: No  . Sexually Active: Not on file   Other Topics Concern  . Not on file   Social History Narrative  . No narrative on file      Physical Exam: BP 112/78  Pulse 72  Ht 6' (1.829 m)  Wt 194 lb 6.4 oz (88.179 kg)  BMI 26.37 kg/m2  SpO2 99% Constitutional: generally well-appearing Psychiatric: alert and oriented x3 Abdomen: soft, nontender, nondistended, no obvious ascites, no peritoneal signs, normal bowel sounds     Assessment and plan: [age]o. male with ulcerative colitis under good control on an immune modulator  He will  continue on azathioprine at 200 mg daily. He needs CBC and complete metabolic profile in about 4 months time and a return office visit in 6 months. He knows to call sooner if any troubles of note he gets flu shots annually

## 2012-02-24 ENCOUNTER — Encounter: Payer: Self-pay | Admitting: Gastroenterology

## 2012-03-13 ENCOUNTER — Telehealth: Payer: Self-pay

## 2012-03-13 DIAGNOSIS — K5190 Ulcerative colitis, unspecified, without complications: Secondary | ICD-10-CM

## 2012-03-13 NOTE — Telephone Encounter (Signed)
Message copied by Barron Alvine on Mon Mar 13, 2012  9:59 AM ------      Message from: Barron Alvine      Created: Wed Nov 03, 2011  9:42 AM       Pt to get cbc cmet end of April see 11/03/11 note

## 2012-03-14 NOTE — Telephone Encounter (Signed)
Pt notified to have labs

## 2013-02-21 ENCOUNTER — Telehealth: Payer: Self-pay | Admitting: Gastroenterology

## 2013-02-21 NOTE — Telephone Encounter (Signed)
Pt has not been seen since 10/2011.  Has an appt for an ROV 03/30/13.  Would like refill on azathioprine.  Pt will need to see a GI in [place] for any med refills and for f/u appt.  Left message on machine to call back

## 2013-02-21 NOTE — Telephone Encounter (Signed)
Pt wants to continue to see Dr Ardis Hughs and make the trip from [place].  He will keep appt as scheduled.  Pt also says he does not need any refills at this time.

## 2013-03-16 ENCOUNTER — Telehealth: Payer: Self-pay | Admitting: Gastroenterology

## 2013-03-16 MED ORDER — AZATHIOPRINE 50 MG PO TABS: 200.0000 mg | ORAL_TABLET | Freq: Every day | ORAL | Status: DC

## 2013-03-16 NOTE — Telephone Encounter (Signed)
Pt rx has been sent for 1 month he must keep appt for further refills pt has been notified

## 2013-03-30 ENCOUNTER — Encounter: Payer: Self-pay | Admitting: Gastroenterology

## 2013-03-30 ENCOUNTER — Ambulatory Visit (INDEPENDENT_AMBULATORY_CARE_PROVIDER_SITE_OTHER): Payer: BC Managed Care – PPO | Admitting: Gastroenterology

## 2013-03-30 ENCOUNTER — Other Ambulatory Visit (INDEPENDENT_AMBULATORY_CARE_PROVIDER_SITE_OTHER): Payer: BC Managed Care – PPO

## 2013-03-30 VITALS — BP 124/80 | HR 84 | Ht 72.0 in | Wt 183.0 lb

## 2013-03-30 DIAGNOSIS — K5190 Ulcerative colitis, unspecified, without complications: Secondary | ICD-10-CM

## 2013-03-30 DIAGNOSIS — Z23 Encounter for immunization: Secondary | ICD-10-CM

## 2013-03-30 LAB — COMPREHENSIVE METABOLIC PANEL
ALT: 37 U/L (ref 0–53)
AST: 32 U/L (ref 0–37)
Calcium: 9.4 mg/dL (ref 8.4–10.5)
Chloride: 105 mEq/L (ref 96–112)
Creatinine, Ser: 1.1 mg/dL (ref 0.4–1.5)
Potassium: 3.9 mEq/L (ref 3.5–5.1)

## 2013-03-30 LAB — CBC WITH DIFFERENTIAL/PLATELET
Basophils Absolute: 0 10*3/uL (ref 0.0–0.1)
Eosinophils Absolute: 0 10*3/uL (ref 0.0–0.7)
Hemoglobin: 13.2 g/dL (ref 13.0–17.0)
Lymphocytes Relative: 16.5 % (ref 12.0–46.0)
Lymphs Abs: 1.4 10*3/uL (ref 0.7–4.0)
MCHC: 34.2 g/dL (ref 30.0–36.0)
Neutro Abs: 6.6 10*3/uL (ref 1.4–7.7)
RDW: 17.9 % — ABNORMAL HIGH (ref 11.5–14.6)

## 2013-03-30 MED ORDER — AZATHIOPRINE 50 MG PO TABS: 200.0000 mg | ORAL_TABLET | Freq: Every day | ORAL | Status: DC

## 2013-03-30 NOTE — Addendum Note (Signed)
Addended by: [place], Louellen Haldeman E on: 03/30/2013 04:50 PM   Modules accepted: Orders

## 2013-03-30 NOTE — Patient Instructions (Addendum)
Flu shot annually. Pneumococcal vaccination, can do this now for you. Bone densitometry (will schedule for your next [place] visit) , Vit D (25 OH level) today. You will have labs checked today in the basement lab.  Please head down after you check out with the front desk  (cbc, cmet). Will need repeat CBC, cmet in 6 months. This can be done locally in [place]. Please return to see Dr. Ardis Hughs in in 1 year.   Patient given written orders to get a CBC and CMET in [place] in 6 months.

## 2013-03-30 NOTE — Progress Notes (Signed)
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 Colonoscopy March 2007 showed mild inflammation up to 15 cm. Biopsies proved chronic active colitis, responded very well to daily Canasa suppositories. Repeat colonoscopy October 2009 during new flare showed moderate pan colitis. Responded to steroids quickly. He had not been taking asacol except for intermittent minor flares. Flex sigmoidoscopy May, 2010: Severe, left-sided predominately colitis with a transition at 30 cm. Also a tubular adenoma was removed. 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 June, 2010 increased azathioprine 150 mg a day. October, 2010 increased azathioprine to 200 mg a day. Stopped remicade, per patient request, and on solo azathiaprian October 2010. December 2011, doing well on azathiaprine 2069ma day for over a year now. February, 2012 doing well on azathioprine 200 mg a day. June 2012 continues to do well on 20041mzathiaprine a day. Labs 10/12 show normal LFTs, normal WBC, Hb (MCV 108).  IBD description:  (pan colitis, currently well controled, no exteranstinal manifestations.)  Corticosteroid sparing therapy prescribed: yes  Bone loss assessment  Vitamin D 25-OH level; April 2014   Bone Density Assessment; recommended April 2014, scheduling conflict  TB testing prior to Anti TNF therapy:  NA  Hep B surface Ag, Hep B surface Antibody before Anti TNF therapy:  NA  Influenza immunization :  April 2014 he does [place] ; done April 2014  Tobacco Screening and counciling (every 2 years); yes   2. Duodenal ulcer, EGD october 2009, in setting of NSAIDs/steroids. Biopsies of stomach showed no H. pylori.   HPI: This is a   very  pleasant [age]ar old man whom I last saw about a year and half ago.   Last visit 12/12.  [place]oved [place] azathiaprine for a brief time.  Restarted azathiaprine at 200m85mr day for 3 months.  Was flaring while off, cannot recall the details of it.  Has a hemorrhoid that is a bit bothersome. HE feels it, it is a bit tender.  Has been applying Prep H for the past week, it is improving.  Having 1 formed, brown stools daily usually.       Past Medical History  Diagnosis Date  . Colitis     PAN  . Duodenal ulcer   . Testicular disorder   . Proctitis   . Heartburn   . Abnormal LFTs     History reviewed. No pertinent past surgical history.  Current Outpatient Prescriptions  Medication Sig Dispense Refill  . azaTHIOprine (IMURAN) 50 MG tablet Take 4 tablets (200 mg total) by mouth daily. 4 pills once daily  120 tablet  0  . Pramox-PE-Glycerin-Petrolatum (PREPARATION H RE) Place rectally as directed.       No current facility-administered medications for this visit.    Allergies as of 03/30/2013  . (No Known Allergies)    Family History  Problem Relation Age of Onset  . Diabetes Mother   . COPD Father     History   Social History  . Marital Status: Married    Spouse Name: N/A    Number of Children: N/A  . Years of Education: N/A   Occupational History  . MarkRadiographer, therapeuticSocial History Main Topics  . Smoking status: Current Every Day Smoker  .  Smokeless tobacco: Never Used  . Alcohol Use: Yes  . Drug Use: No  . Sexually Active: Not on file   Other Topics Concern  . Not on file   Social History Narrative  . No narrative on file      Physical Exam: BP 124/80  Pulse 84  Ht 6' (1.829 m)  Wt 183 lb (83.008 kg)  BMI 24.81 kg/m2 Constitutional: generally well-appearing Psychiatric: alert and oriented x3 Abdomen: soft, nontender, nondistended, no obvious ascites, no peritoneal signs, normal bowel sounds     Assessment and plan: 42 y.o.  male with Pan colitis  His symptoms are under good control on azathioprine 200 mg per day. He has not had labs checked in quite a while since he moved to [place]. Today we'll get CBC, complete metabolic profile. We will immunize him for pneumococcus. He gets influenza vaccinations yearly through his PCP. He has not had vitamin D checked and we will do that today. I recommended bone densitometry checking which she will try to schedule he gets back in town here in [place] in this summer. I called in a new prescription for azathioprine and he will get a repeat set of labs in 6 months done locally in [place] and he will have those results faxed to me. I would like to see him back in one year

## 2013-12-04 ENCOUNTER — Telehealth: Payer: Self-pay | Admitting: Gastroenterology

## 2013-12-04 NOTE — Telephone Encounter (Signed)
Rec'd from [facility] forward 7 pages to [place]

## 2013-12-11 ENCOUNTER — Telehealth: Payer: Self-pay | Admitting: Gastroenterology

## 2013-12-11 NOTE — Telephone Encounter (Signed)
Labs from [place], 11/29/2013: CBC normal (WBC 5.9, MCV 103, Hb 14.2, Plt 245; CMET normal  Please call [facility].  I reviewed labs from [place], look good.  No changes to him meds.  Needs rov in 6 months with me (CBC, cmet the day prior if possible).

## 2013-12-11 NOTE — Telephone Encounter (Signed)
Pt aware will call to set up labs and rov

## 2013-12-11 NOTE — Telephone Encounter (Signed)
Left message on machine to call back  

## 2014-03-30 ENCOUNTER — Other Ambulatory Visit: Payer: Self-pay | Admitting: Gastroenterology

## 2014-05-15 ENCOUNTER — Telehealth: Payer: Self-pay | Admitting: Gastroenterology

## 2014-05-16 MED ORDER — AZATHIOPRINE 50 MG PO TABS: ORAL_TABLET | ORAL | Status: DC

## 2014-05-16 NOTE — Telephone Encounter (Signed)
The pt will call and give a fax number to fax an order for labs

## 2014-06-11 ENCOUNTER — Telehealth: Payer: Self-pay

## 2014-06-11 DIAGNOSIS — K51219 Ulcerative (chronic) proctitis with unspecified complications: Secondary | ICD-10-CM

## 2014-06-11 NOTE — Telephone Encounter (Signed)
Message copied by Barron Alvine on Tue Jun 11, 2014  9:02 AM ------      Message from: Barron Alvine      Created: Tue Dec 11, 2013  4:57 PM       Needs rov in 6 months with me (CBC, cmet the day prior if possible). ------

## 2014-06-11 NOTE — Telephone Encounter (Signed)
Pt scheduled for ROV and will have labs tomorrow

## 2014-06-12 ENCOUNTER — Other Ambulatory Visit (INDEPENDENT_AMBULATORY_CARE_PROVIDER_SITE_OTHER): Payer: BC Managed Care – PPO

## 2014-06-12 DIAGNOSIS — K51219 Ulcerative (chronic) proctitis with unspecified complications: Secondary | ICD-10-CM

## 2014-06-12 DIAGNOSIS — K5120 Ulcerative (chronic) proctitis without complications: Secondary | ICD-10-CM

## 2014-06-12 LAB — CBC WITH DIFFERENTIAL/PLATELET
BASOS PCT: 0.4 % (ref 0.0–3.0)
Basophils Absolute: 0 10*3/uL (ref 0.0–0.1)
EOS PCT: 1.1 % (ref 0.0–5.0)
Eosinophils Absolute: 0.1 10*3/uL (ref 0.0–0.7)
HEMATOCRIT: 45.3 % (ref 39.0–52.0)
HEMOGLOBIN: 15.2 g/dL (ref 13.0–17.0)
LYMPHS ABS: 1.7 10*3/uL (ref 0.7–4.0)
Lymphocytes Relative: 21.9 % (ref 12.0–46.0)
MCHC: 33.5 g/dL (ref 30.0–36.0)
MCV: 104.6 fl — AB (ref 78.0–100.0)
MONO ABS: 0.4 10*3/uL (ref 0.1–1.0)
MONOS PCT: 5.1 % (ref 3.0–12.0)
NEUTROS ABS: 5.5 10*3/uL (ref 1.4–7.7)
Neutrophils Relative %: 71.5 % (ref 43.0–77.0)
Platelets: 252 10*3/uL (ref 150.0–400.0)
RBC: 4.33 Mil/uL (ref 4.22–5.81)
RDW: 16 % — ABNORMAL HIGH (ref 11.5–15.5)
WBC: 7.6 10*3/uL (ref 4.0–10.5)

## 2014-06-12 LAB — COMPREHENSIVE METABOLIC PANEL
ALK PHOS: 40 U/L (ref 39–117)
ALT: 30 U/L (ref 0–53)
AST: 26 U/L (ref 0–37)
Albumin: 4.5 g/dL (ref 3.5–5.2)
BILIRUBIN TOTAL: 2.2 mg/dL — AB (ref 0.2–1.2)
BUN: 11 mg/dL (ref 6–23)
CO2: 30 meq/L (ref 19–32)
CREATININE: 0.9 mg/dL (ref 0.4–1.5)
Calcium: 9.4 mg/dL (ref 8.4–10.5)
Chloride: 102 mEq/L (ref 96–112)
GFR: 103.68 mL/min (ref >60.00)
GLUCOSE: 95 mg/dL (ref 70–99)
Potassium: 3.8 mEq/L (ref 3.5–5.1)
SODIUM: 138 meq/L (ref 135–145)
TOTAL PROTEIN: 6.9 g/dL (ref 6.0–8.3)

## 2014-06-17 ENCOUNTER — Encounter: Payer: Self-pay | Admitting: Gastroenterology

## 2014-06-17 ENCOUNTER — Ambulatory Visit (INDEPENDENT_AMBULATORY_CARE_PROVIDER_SITE_OTHER): Payer: BC Managed Care – PPO | Admitting: Gastroenterology

## 2014-06-17 VITALS — BP 100/70 | HR 68 | Ht 71.0 in | Wt 181.2 lb

## 2014-06-17 DIAGNOSIS — K5190 Ulcerative colitis, unspecified, without complications: Secondary | ICD-10-CM

## 2014-06-17 DIAGNOSIS — K51919 Ulcerative colitis, unspecified with unspecified complications: Secondary | ICD-10-CM

## 2014-06-17 MED ORDER — AZATHIOPRINE 50 MG PO TABS: ORAL_TABLET | ORAL | Status: DC

## 2014-06-17 NOTE — Progress Notes (Signed)
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 Colonoscopy March 2007 showed mild inflammation up to 15 cm. Biopsies proved chronic active colitis, responded very well to daily Canasa suppositories. Repeat colonoscopy October 2009 during new flare showed moderate pan colitis. Responded to steroids quickly. He had not been taking asacol except for intermittent minor flares. Flex sigmoidoscopy May, 2010: Severe, left-sided predominately colitis with a transition at 30 cm. Also a tubular adenoma was removed. 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 June, 2010 increased azathioprine 150 mg a day. October, 2010 increased azathioprine to 200 mg a day. Stopped remicade, per patient request, and on solo azathiaprian October 2010. December 2011, doing well on azathiaprine 2039ma day for over a year now. February, 2012 doing well on azathioprine 200 mg a day. June 2012 continues to do well on 20039mzathiaprine a day. Labs 10/12 show normal LFTs, normal WBC, Hb (MCV 108).  IBD description: (pan colitis, currently well controled, no exteranstinal manifestations.)  Corticosteroid sparing therapy prescribed: yes  Bone loss assessment  Vitamin D 25-OH level; April 2014  Bone Density Assessment; recommended April 2014, scheduling conflict TB testing prior to Anti TNF therapy: NA  Hep B surface Ag, Hep B surface Antibody before Anti TNF therapy: NA  Influenza immunization : April 2014 he does this annually [place] ; done April 2014  Tobacco Screening and counciling (every 2 years); yes  2. Duodenal ulcer, EGD october 2009, in setting of NSAIDs/steroids. Biopsies of stomach showed no H. pylori.   HPI: This is a very pleasant very pleasant  [age]ar old man whom I last saw about a year ago.  [place] No [place], no bleeding.  A bit gassy lately.  CBC and complete metabolic profile late last week were essentially normal except for total bilirubin 2.2 with a only very slightly elevated direct bilirubin. 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    Past Medical History  Diagnosis Date  . Colitis     PAN  . Duodenal ulcer   . Testicular disorder   . Proctitis   . Heartburn   . Abnormal LFTs     No past surgical history on file.  Current Outpatient Prescriptions  Medication Sig Dispense Refill  . azaTHIOprine (IMURAN) 50 MG tablet TAKE 4 TABLETS BY MOUTH EVERY DAY  120 tablet  0  . Pramox-PE-Glycerin-Petrolatum (PREPARATION H RE) Place rectally as directed.      . Testosterone (TESTOPEL) 75 MG PLLT by Implant route every 4 (four) months.       No current facility-administered medications for this visit.    Allergies as of 06/17/2014  . (No Known Allergies)    Family History  Problem Relation Age of Onset  . Diabetes Mother   . COPD Father     History   Social History  . Marital Status: Married    Spouse Name: N/A    Number of Children: 0  . Years of Education: N/A   Occupational History  . MarRadiographer, therapeutic.     Social History Main Topics  . Smoking status: Current Every Day Smoker  . Smokeless tobacco: Never Used  . Alcohol Use: Yes  . Drug Use: No  . Sexual Activity: Not on file  Other Topics Concern  . Not on file   Social History Narrative  . No narrative on file      Physical Exam: Ht 5' 11"  (1.803 m)  Wt 181 lb 4 oz (82.214 kg)  BMI 25.29 kg/m2 Constitutional: generally well-appearing Psychiatric: alert and oriented x3 Abdomen: soft, nontender, nondistended, no obvious ascites, no peritoneal signs, normal bowel sounds     Assessment and plan: 43 y.o. male with long-standing ulcerative colitis  He is tolerating azathioprine at 200 mg  daily. He has been on this dose for at least 2-3 years now. He will continue on indefinitely for now. He'll a repeat set of labs including CBC, complete metabolic profile in 3 months locally and have those results faxed here. He will return to see me in about 12 months.

## 2014-06-17 NOTE — Patient Instructions (Signed)
Continue on azathiaprine 217m per day, new script called in. Labs in 3 months (cbc, cmet), can be done locally and results faxed. Please return to see Dr. JArdis Hughsin 12 months.

## 2014-08-22 ENCOUNTER — Telehealth: Payer: Self-pay | Admitting: Gastroenterology

## 2014-08-22 MED ORDER — AZATHIOPRINE 50 MG PO TABS: ORAL_TABLET | ORAL | Status: DC

## 2014-08-22 NOTE — Telephone Encounter (Signed)
rx was sent as requested

## 2014-12-30 ENCOUNTER — Telehealth: Payer: Self-pay | Admitting: Gastroenterology

## 2014-12-30 NOTE — Telephone Encounter (Signed)
Rec'd from [facility] forward 8 pages to Dr. Ardis Hughs

## 2015-04-28 ENCOUNTER — Encounter: Payer: Self-pay | Admitting: Gastroenterology

## 2015-06-02 ENCOUNTER — Encounter: Payer: Self-pay | Admitting: Gastroenterology

## 2015-08-04 ENCOUNTER — Encounter: Payer: Self-pay | Admitting: Gastroenterology

## 2015-08-04 ENCOUNTER — Other Ambulatory Visit (INDEPENDENT_AMBULATORY_CARE_PROVIDER_SITE_OTHER): Payer: Self-pay

## 2015-08-04 ENCOUNTER — Ambulatory Visit (INDEPENDENT_AMBULATORY_CARE_PROVIDER_SITE_OTHER): Payer: Self-pay | Admitting: Gastroenterology

## 2015-08-04 VITALS — BP 100/66 | HR 76 | Ht 71.0 in | Wt 192.0 lb

## 2015-08-04 DIAGNOSIS — K51919 Ulcerative colitis, unspecified with unspecified complications: Secondary | ICD-10-CM

## 2015-08-04 LAB — COMPREHENSIVE METABOLIC PANEL
ALBUMIN: 4.5 g/dL (ref 3.5–5.2)
ALK PHOS: 39 U/L (ref 39–117)
ALT: 16 U/L (ref 0–53)
AST: 18 U/L (ref 0–37)
BUN: 16 mg/dL (ref 6–23)
CHLORIDE: 103 meq/L (ref 96–112)
CO2: 31 mEq/L (ref 19–32)
CREATININE: 1.09 mg/dL (ref 0.40–1.50)
Calcium: 9.6 mg/dL (ref 8.4–10.5)
GFR: 78.44 mL/min (ref >60.00)
GLUCOSE: 83 mg/dL (ref 70–99)
Potassium: 3.9 mEq/L (ref 3.5–5.1)
SODIUM: 140 meq/L (ref 135–145)
TOTAL PROTEIN: 6.9 g/dL (ref 6.0–8.3)
Total Bilirubin: 1.2 mg/dL (ref 0.2–1.2)

## 2015-08-04 LAB — CBC WITH DIFFERENTIAL/PLATELET
Basophils Absolute: 0 10*3/uL (ref 0.0–0.1)
Basophils Relative: 0.3 % (ref 0.0–3.0)
EOS ABS: 0.1 10*3/uL (ref 0.0–0.7)
Eosinophils Relative: 0.9 % (ref 0.0–5.0)
HCT: 43.3 % (ref 39.0–52.0)
HEMOGLOBIN: 14.6 g/dL (ref 13.0–17.0)
Lymphocytes Relative: 19 % (ref 12.0–46.0)
Lymphs Abs: 1.3 10*3/uL (ref 0.7–4.0)
MCHC: 33.8 g/dL (ref 30.0–36.0)
MCV: 106.6 fl — ABNORMAL HIGH (ref 78.0–100.0)
MONO ABS: 0.5 10*3/uL (ref 0.1–1.0)
Monocytes Relative: 7.6 % (ref 3.0–12.0)
NEUTROS PCT: 72.2 % (ref 43.0–77.0)
Neutro Abs: 5.1 10*3/uL (ref 1.4–7.7)
Platelets: 224 10*3/uL (ref 150.0–400.0)
RBC: 4.06 Mil/uL — AB (ref 4.22–5.81)
RDW: 15.6 % — ABNORMAL HIGH (ref 11.5–15.5)
WBC: 7.1 10*3/uL (ref 4.0–10.5)

## 2015-08-04 NOTE — Patient Instructions (Addendum)
OK to decrease your azathiaprine to 11m once daily.  If you have any signs of flare, please call. You will have labs checked today in the basement lab.  Please head down after you check out with the front desk  (cbc, cmet). We will fax this note and your lab results June Ro, MD ([place], fax [phone] Please return to see Dr. JArdis Hughsin 12 months. Call when you are ready for new prescription for the azathiaprine.

## 2015-08-04 NOTE — Progress Notes (Signed)
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 Colonoscopy March 2007 showed mild inflammation up to 15 cm. Biopsies proved chronic active colitis, responded very well to daily Canasa suppositories. Repeat colonoscopy October 2009 during new flare showed moderate pan colitis. Responded to steroids quickly. He had not been taking asacol except for intermittent minor flares. Flex sigmoidoscopy May, 2010: Severe, left-sided predominately colitis with a transition at 30 cm. Also a tubular adenoma was removed. 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 June, 2010 increased azathioprine 150 mg a day. October, 2010 increased azathioprine to 200 mg a day. Stopped remicade, per patient request, and on solo azathiaprian October 2010. December 2011, doing well on azathiaprine 2081ma day for over a year now. February, 2012 doing well on azathioprine 200 mg a day. June 2012 continues to do well on 20070mzathiaprine a day. Labs 10/12 show normal LFTs, normal WBC, Hb (MCV 108).  IBD description: (pan colitis, currently well controled, no exteranstinal manifestations.)   Corticosteroid sparing therapy prescribed: yes   Bone loss assessment   Vitamin D 25-OH level; April 2014   Bone Density Assessment; recommended April 2014, scheduling conflict  TB testing prior to Anti TNF therapy: NA   Hep B surface Ag, Hep B surface Antibody before Anti TNF therapy: NA   Influenza immunization : April 2014 he does this annually [place] ; done April 2014   Tobacco Screening and counciling (every 2 years); yes   2. Duodenal ulcer, EGD october 2009, in setting of NSAIDs/steroids. Biopsies of stomach showed no H. Pylori.   Labs 12/2014 (faxed  here) show macrocytosis, otherwise normal CBC, normal LFTs (T bili was normal.)   HPI: This is a  very pleasant [age]ar old man whom I last saw about a year ago  June Ro, MD ([place] PCP, fax [phone] complaint is  ulcerative colitis  Allergy season, abd pains but no change in his bowels (no bleeding, diarrhea).    azathiaprine 200m39mce daily.  Screening examination 2019 (10 years from pan colitis diagnosis).  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  Dermatologist feels may be due to immune supressing     Past Medical History  Diagnosis Date  . Colitis     PAN  . Duodenal ulcer   . Testicular disorder   . Proctitis   . Heartburn   . Abnormal LFTs     History reviewed. No pertinent past surgical history.  Current Outpatient Prescriptions  Medication Sig Dispense Refill  . Ascorbic Acid (VITAMIN C) 1000 MG tablet Take 1,000 mg by mouth daily.    . azMarland KitchenTHIOprine (IMURAN) 50 MG tablet TAKE 4 TABLETS BY MOUTH EVERY DAY 360 tablet 3  . Cholecalciferol (VITAMIN D) 2000 UNITS CAPS Take 5,000 Units by mouth.    . KRMarland KitchenLL OIL PO Take by mouth.    . Lactobacillus (ACIDOPHILUS PO) Take by mouth.    . milk thistle 175 MG tablet Take 175 mg by mouth daily.    . Misc Natural Products (OSTEO BI-FLEX JOINT SHIELD PO) Take by mouth.    . Multiple Vitamins-Minerals (MULTI COMPLETE PO) Take by mouth.    . saw palmetto 500 MG capsule Take 500 mg by mouth daily.    . Testosterone (TESTOPEL) 75 MG PLLT by  Implant route every 4 (four) months.     No current facility-administered medications for this visit.    Allergies as of 08/04/2015  . (No Known Allergies)    Family History  Problem Relation Age of Onset  . Diabetes Mother   . COPD Father     Social History   Social History  . Marital Status: Married    Spouse Name: N/A  . Number of Children: 0  . Years of Education: N/A   Occupational History  . Radiographer, therapeutic   .     Social History Main Topics  .  Smoking status: Current Every Day Smoker  . Smokeless tobacco: Never Used  . Alcohol Use: Yes  . Drug Use: No  . Sexual Activity: Not on file   Other Topics Concern  . Not on file   Social History Narrative     Physical Exam: BP 100/66 mmHg  Pulse 76  Ht 5' 11"  (1.803 m)  Wt 192 lb (87.091 kg)  BMI 26.79 kg/m2 Constitutional: generally well-appearing Psychiatric: alert and oriented x3 Abdomen: soft, nontender, nondistended, no obvious ascites, no peritoneal signs, normal bowel sounds   Assessment and plan: 44 y.o. male with ulcerative colitis  He is doing very well from a GI perspective on azathioprine 200 mg once daily. He has had some warts develop on his hand. He has seen dermatology about this and they feel it may be related to his mild immune suppression from azathioprine. He was wondering if he can cut back a bit on his azathioprine. We discussed this at length I think it is probably safe to try cutting back to 150 mg once daily. I'm not sure the couple was cutting back much more than that. He will give that a try, 150 mg once daily he knows to call if he has any signs or symptoms of flaring again. He will get a basic set of labs including CBC and complete metabolic profile. He will return to see me in 12 months and sooner if needed.  We discussed screening examinations for high risk situations like his area generally these started about 10 years after the onset of extensive colitis which would put them to start screening around 2019.   Like any correspondence, lab results, progress notes sent to his PCP for review he really likes his primary care physician I'm happy to do that for him.  Owens Loffler, MD [facility] 08/04/2015, 2:28 PM

## 2015-08-05 ENCOUNTER — Other Ambulatory Visit: Payer: Self-pay

## 2015-08-05 DIAGNOSIS — K51919 Ulcerative colitis, unspecified with unspecified complications: Secondary | ICD-10-CM

## 2015-08-12 ENCOUNTER — Other Ambulatory Visit: Payer: Self-pay | Admitting: Gastroenterology

## 2016-09-10 ENCOUNTER — Telehealth: Payer: Self-pay | Admitting: Gastroenterology

## 2016-09-10 MED ORDER — AZATHIOPRINE 50 MG PO TABS: 150.0000 mg | ORAL_TABLET | Freq: Every day | ORAL | 0 refills | Status: DC

## 2016-09-10 NOTE — Telephone Encounter (Signed)
Pt prescription has been refilled, he must keep appt as scheduled to continue getting refills.

## 2016-11-29 ENCOUNTER — Encounter: Payer: Self-pay | Admitting: *Deleted

## 2016-12-01 ENCOUNTER — Encounter (INDEPENDENT_AMBULATORY_CARE_PROVIDER_SITE_OTHER): Payer: Self-pay

## 2016-12-01 ENCOUNTER — Ambulatory Visit (INDEPENDENT_AMBULATORY_CARE_PROVIDER_SITE_OTHER): Payer: PRIVATE HEALTH INSURANCE | Admitting: Gastroenterology

## 2016-12-01 ENCOUNTER — Encounter: Payer: Self-pay | Admitting: Gastroenterology

## 2016-12-01 VITALS — BP 106/78 | HR 68 | Ht 71.0 in | Wt 191.5 lb

## 2016-12-01 DIAGNOSIS — K5190 Ulcerative colitis, unspecified, without complications: Secondary | ICD-10-CM

## 2016-12-01 MED ORDER — AZATHIOPRINE 50 MG PO TABS: 150.0000 mg | ORAL_TABLET | Freq: Every day | ORAL | 3 refills | Status: DC

## 2016-12-01 NOTE — Progress Notes (Signed)
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 Colonoscopy March 2007 showed mild inflammation up to 15 cm. Biopsies proved chronic active colitis, responded very well to daily Canasa suppositories. Repeat colonoscopy October 2009 during new flare showed moderate pan colitis. Responded to steroids quickly. He had not been taking asacol except for intermittent minor flares. Flex sigmoidoscopy May, 2010: Severe, left-sided predominately colitis with a transition at 30 cm. Also a tubular adenoma was removed. 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 June, 2010 increased azathioprine 150 mg a day. October, 2010 increased azathioprine to 200 mg a day. Stopped remicade, per patient request, and on solo azathiaprian October 2010. December 2011, doing well on azathiaprine 2039ma day for over a year now. February, 2012 doing well on azathioprine 200 mg a day. June 2012 continues to do well on 20043mzathiaprine a day. Labs 10/12 show normal LFTs, normal WBC, Hb (MCV 108).  2016 cut back azathiaprine to 150m32mily because of warts on his hands, concern that the immunomodulator was contributint. IBD description: (pan colitis, currently well controled, no exteranstinal manifestations.)   Corticosteroid sparing therapy prescribed: yes   Bone loss assessment   Vitamin D 25-OH level; April 2014   Bone Density Assessment; recommended April 2014, scheduling conflict  TB testing prior to Anti TNF therapy: NA   Hep B surface Ag, Hep B surface Antibody before Anti TNF therapy: NA   Influenza immunization : April 2014 he [place] ; done April 2014   Tobacco Screening and counciling (every 2 years); yes   2.  Duodenal ulcer, EGD october 2009, in setting of NSAIDs/steroids. Biopsies of stomach showed no H. Pylori.   HPI: This is a   very pleasant 46 y35r old man whom I last saw September 2016. At that time we decreased his azathioprine to 150 mg from 200 mg daily because of some warts he had noticed on his hands. That decrease in medicine made no difference with the warts. His colitis has also remained completely quiet sent clinically.  Chief complaint is ulcerative colitis  His bowels are fine.  No bleeding.  One solid BM daily.  Overall stable weight except for holiday's with some weight gain.  Lives in [place]. [place]1.17 on his phone, [place]. review of labs; not anemic, normal WBC, normal LFTs  ROS: complete GI ROS as described in HPI.  Constitutional:  No unintentional weight loss   Past Medical History:  Diagnosis Date  . Abnormal LFTs   . Colitis    PAN  . Duodenal ulcer   . Heartburn   . Proctitis   . Testicular disorder   . Tubular adenoma of colon   . Ulcerative colitis (HCC)[place]  History reviewed. No pertinent surgical history.  Current Outpatient Prescriptions  Medication Sig Dispense Refill  . Ascorbic Acid (VITAMIN C) 1000 MG tablet Take 1,000 mg by mouth daily.    . azMarland KitchenTHIOprine (IMURAN) 50 MG tablet Take 3 tablets (150 mg total) by mouth daily. 270 tablet 0  . Cholecalciferol (VITAMIN D) 2000 UNITS CAPS Take 5,000 Units by mouth.    . DULoxetine (CYMBALTA) 30 MG capsule Take 1 capsule by mouth daily.    . KRMarland KitchenLL OIL PO  Take by mouth.    . Lactobacillus (ACIDOPHILUS PO) Take by mouth.    . milk thistle 175 MG tablet Take 175 mg by mouth daily.    . Misc Natural Products (OSTEO BI-FLEX JOINT SHIELD PO) Take by mouth.    . Multiple Vitamins-Minerals (MULTI COMPLETE PO) Take by mouth.    . saw palmetto 500 MG capsule Take 500 mg by mouth daily.    . Testosterone (TESTOPEL) 75 MG PLLT by Implant route every 4 (four) months.      No current facility-administered medications for this visit.     Allergies as of 12/01/2016  . (No Known Allergies)    Family History  Problem Relation Age of Onset  . Diabetes Mother   . COPD Father     Social History   Social History  . Marital status: Married    Spouse name: N/A  . Number of children: 0  . Years of education: N/A   Occupational History  . Government social research officer  .  [employer]   Social History Main Topics  . Smoking status: Current Every Day Smoker  . Smokeless tobacco: Never Used  . Alcohol use Yes  . Drug use: No  . Sexual activity: Not on file   Other Topics Concern  . Not on file   Social History Narrative  . 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 He is currently on 150 mg of azathioprine once daily. His last labs were done in [place] about 2 months ago. They look normal. He will have repeat labs done there in March and those results will be faxed here. I would like to see him in about one year and he knows to call here sooner if he has any issues. His original diagnosis of pan ulcerative colitis was in 2006. We agreed that we would start high risk screening in 2019, around August since that is easiest with his work schedule. I put him in for recall recommended.   Owens Loffler, MD [facility] 12/01/2016, 9:00 AM

## 2016-12-01 NOTE — Patient Instructions (Addendum)
We have sent the following medications to your pharmacy for you to pick up at your convenience: Azathioprine 150 mg daily  We have given you orders to have labwork in March 2018. Please have the labs faxed to Dr Ardis Hughs at [phone]. We will want labs about every 4-5 months.  Please return to see Dr. Ardis Hughs in 1 year, sooner if any troubles.  You will be due for a recall colonoscopy in 06/2018. We will send you a reminder in the mail when it gets closer to that time.

## 2017-03-08 ENCOUNTER — Telehealth: Payer: Self-pay | Admitting: Gastroenterology

## 2017-03-08 NOTE — Telephone Encounter (Signed)
Labs dated 02/2017: cbc normal Hb, normal WBC, MCV a bit elevated; alk phos normal.  Please let him know I reviewed the labs. All look OK.  Needs repeat labs (CBC, LFTs) in 4 months with results faxed here again.

## 2017-03-09 NOTE — Telephone Encounter (Signed)
Left message on machine to call back  

## 2017-03-10 NOTE — Telephone Encounter (Signed)
The pt has been notified and will have labs in 4 months and have the results faxed to our office

## 2017-07-11 ENCOUNTER — Telehealth: Payer: Self-pay

## 2017-07-11 DIAGNOSIS — K51919 Ulcerative colitis, unspecified with unspecified complications: Secondary | ICD-10-CM

## 2017-07-11 NOTE — Telephone Encounter (Signed)
-----   Message from Jeoffrey Massed, RN sent at 03/09/2017  9:25 AM EDT ----- Needs repeat labs (CBC, LFTs) in 4 months with results faxed here again.

## 2017-07-11 NOTE — Telephone Encounter (Signed)
Left message on machine to call back  

## 2017-07-12 NOTE — Telephone Encounter (Signed)
Left message on machine to call back  

## 2017-07-12 NOTE — Telephone Encounter (Signed)
I spoke with the pt and was given the fax number to Dr Carolyn Stare for lab results.  [phone] fax number

## 2017-09-13 ENCOUNTER — Telehealth: Payer: Self-pay | Admitting: Gastroenterology

## 2017-09-13 NOTE — Telephone Encounter (Signed)
Labs dated 09/08/17: Wbc 8 Hb 14.7 plt 242 cmet normal   Can you let him know I reviewed his labs; all looks good.  He needs repeat cbc, cmet in 4 months and rov with me shortly afterwards.   Thanks

## 2017-09-14 ENCOUNTER — Other Ambulatory Visit: Payer: Self-pay

## 2017-09-14 DIAGNOSIS — K51919 Ulcerative colitis, unspecified with unspecified complications: Secondary | ICD-10-CM

## 2017-09-14 NOTE — Telephone Encounter (Signed)
Patient advised of labs and Dr. Ardis Hughs recommendations. Recall, reminder in Epic, labs ordered.

## 2017-11-23 ENCOUNTER — Telehealth: Payer: Self-pay

## 2017-11-23 NOTE — Telephone Encounter (Signed)
-----   Message from Doristine Counter, RN sent at 09/14/2017  9:32 AM EDT ----- Remind patient to come get his 4 month labs done, please check to see that he has follow up visit with Dr. Ardis Hughs shortly afterwards. Due Feb 2019

## 2017-11-23 NOTE — Telephone Encounter (Signed)
Left message on machine to call back  

## 2017-11-29 NOTE — Telephone Encounter (Signed)
The pt has been advised and will have labs done locally.  He was also scheduled for ROV

## 2018-01-31 ENCOUNTER — Ambulatory Visit: Payer: PRIVATE HEALTH INSURANCE | Admitting: Gastroenterology

## 2018-02-03 ENCOUNTER — Ambulatory Visit: Payer: PRIVATE HEALTH INSURANCE | Admitting: Gastroenterology

## 2018-03-14 ENCOUNTER — Other Ambulatory Visit: Payer: Self-pay | Admitting: Gastroenterology

## 2018-03-14 NOTE — Telephone Encounter (Signed)
Pt must have office visit for further refills

## 2018-03-23 ENCOUNTER — Telehealth: Payer: Self-pay | Admitting: Gastroenterology

## 2018-03-23 NOTE — Telephone Encounter (Signed)
The patient has been notified of this information and all questions answered.  He will keep appt as scheduled

## 2018-03-23 NOTE — Telephone Encounter (Signed)
Blood work from [place] dated March 21, 2018 show essentially normal CBC.  His MCV was 104.  Normal white blood cell count.  Complete metabolic profile was normal.  Can you please call him and let him know I saw that his labs looked fine from last week.  His primary care physician fax them to me.  I look forward to seeing him at return office visit that is already scheduled for about 2 weeks from now.

## 2018-04-11 ENCOUNTER — Encounter: Payer: Self-pay | Admitting: Gastroenterology

## 2018-04-11 ENCOUNTER — Ambulatory Visit (INDEPENDENT_AMBULATORY_CARE_PROVIDER_SITE_OTHER): Payer: BLUE CROSS/BLUE SHIELD | Admitting: Gastroenterology

## 2018-04-11 VITALS — BP 118/82 | HR 75 | Ht 71.75 in | Wt 213.0 lb

## 2018-04-11 DIAGNOSIS — Z1211 Encounter for screening for malignant neoplasm of colon: Secondary | ICD-10-CM

## 2018-04-11 DIAGNOSIS — K51919 Ulcerative colitis, unspecified with unspecified complications: Secondary | ICD-10-CM

## 2018-04-11 MED ORDER — PEG 3350-KCL-NA BICARB-NACL 420 G PO SOLR: 4000.0000 mL | ORAL | 0 refills | Status: DC

## 2018-04-11 NOTE — Patient Instructions (Addendum)
Labs (cbc, cmet) in 22month, OK to have these done locally in [place] and faxed here for review. Continue Azathiaprine at same dose (1563monce daily). You will be set up for a colonoscopy for colon cancer screening since you have had colitis for 10 years now. Normal BMI (Body Mass Index- based on height and weight) is between 19 and 25. Your BMI today is Body mass index is 29.09 kg/m. . Marland Kitchenlease consider follow up  regarding your BMI with your Primary Care Provider.

## 2018-04-11 NOTE — Progress Notes (Signed)
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 Colonoscopy March 2007 showed mild inflammation up to 15 cm. Biopsies proved chronic active colitis, responded very well to daily Canasa suppositories. Repeat colonoscopy October 2009 during new flare showed moderate to severe pan colitis. TI was normal, path + chronic inflammation.  Responded to steroids quickly. He had not been taking asacol except for intermittent minor flares. Flex sigmoidoscopy May, 2010: Severe, left-sided predominately colitis with a transition at 30 cm. Also a tubular adenoma was removed. 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 June, 2010 increased azathioprine 150 mg a day. October, 2010 increased azathioprine to 200 mg a day. Stopped remicade, per patient request, and on solo azathiaprian October 2010. December 2011, doing well on azathiaprine 2090ma day for over a year now. February, 2012 doing well on azathioprine 200 mg a day. June 2012 continues to do well on 20037mzathiaprine a day. Labs 10/12 show normal LFTs, normal WBC, Hb (MCV 108).  2016 cut back azathiaprine to 150m16mily because of warts on his hands, concern that the immunomodulator was contributint. IBD description: (pan colitis, currently well controled, no exteranstinal manifestations.)   Corticosteroid sparing therapy prescribed: yes   Bone loss assessment   Vitamin D 25-OH level; April 2014   Bone Density Assessment; recommended April 2014, scheduling conflict  TB testing prior to Anti TNF therapy: NA   Hep B surface Ag, Hep B surface Antibody before Anti TNF therapy: NA   Influenza immunization : April 2014 he [place] ; done April 2014   Tobacco  Screening and counciling (every 2 years); yes    2. Duodenal ulcer, EGD october 2009, in setting of NSAIDs/steroids. Biopsies of stomach showed no H. Pylori.   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  His MCV was 104.  Normal white blood cell count.  Complete metabolic profile was normal.  He is very good about taking his azathioprine every day.  He is also very good about getting his labs checked every 3 to 4 months locally in [place] having those results sent here for review.  He really has no troubles with his bowels, no significant abdominal pains, no significant diarrhea.  No bleeding.   ROS: complete GI ROS as described in HPI, all other review negative.  Constitutional:  No unintentional weight loss   Past Medical History:  Diagnosis Date  . Abnormal LFTs   . Colitis    PAN  . Duodenal ulcer   . Heartburn   . Proctitis   . Testicular disorder   . Tubular adenoma of colon   . Ulcerative colitis (HCC)[place]  History reviewed. No pertinent surgical history.  Current Outpatient Medications  Medication Sig Dispense Refill  . azaTHIOprine (IMURAN) 50 MG tablet TAKE 3 TABLETS BY MOUTH EVERY DAY 270 tablet 1  . Cholecalciferol (VITAMIN D) 2000 UNITS CAPS Take 5,000 Units by mouth.    . DULoxetine (CYMBALTA) 30 MG capsule Take 1 capsule by  mouth daily.    Marland Kitchen KRILL OIL PO Take by mouth.    . Lactobacillus (ACIDOPHILUS PO) Take by mouth.    . milk thistle 175 MG tablet Take 175 mg by mouth daily.    . Misc Natural Products (OSTEO BI-FLEX JOINT SHIELD PO) Take by mouth.    . Multiple Vitamins-Minerals (MULTI COMPLETE PO) Take by mouth.    . saw palmetto 500 MG capsule Take 500 mg by mouth daily.    Marland Kitchen testosterone cypionate  (DEPOTESTOTERONE CYPIONATE) 100 MG/ML injection Inject into the muscle every 14 (fourteen) days. For IM use only     No current facility-administered medications for this visit.     Allergies as of 04/11/2018  . (No Known Allergies)    Family History  Problem Relation Age of Onset  . Diabetes Mother   . COPD Father     Social History   Socioeconomic History  . Marital status: Married    Spouse name: Not on file  . Number of children: 0  . Years of education: Not on file  . Highest education level: Not on file  Occupational History  . Occupation: Human resources officer: UNEMPLOYED    Employer: [employer]  Social Needs  . Financial resource strain: Not on file  . Food insecurity:    Worry: Not on file    Inability: Not on file  . Transportation needs:    Medical: Not on file    Non-medical: Not on file  Tobacco Use  . Smoking status: Current Every Day Smoker  . Smokeless tobacco: Never Used  Substance and Sexual Activity  . Alcohol use: Yes  . Drug use: No  . Sexual activity: Not on file  Lifestyle  . Physical activity:    Days per week: Not on file    Minutes per session: Not on file  . Stress: Not on file  Relationships  . Social connections:    Talks on phone: Not on file    Gets together: Not on file    Attends religious service: Not on file    Active member of club or organization: Not on file    Attends meetings of clubs or organizations: Not on file    Relationship status: Not on file  . Intimate partner violence:    Fear of current or ex partner: Not on file    Emotionally abused: Not on file    Physically abused: Not on file    Forced sexual activity: Not on file  Other Topics Concern  . Not on file  Social History Narrative  . 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  I recommend we begin proceeding with higher  risk colon cancer screening with a colonoscopy.  Guidelines are ever-changing in regards to this but it has been about 10 years since he was diagnosed.  If the examination is normal with certainly no polyps, no dysplastic biopsies and even no inflammation but I would consider turning his immune modulators  down.  He is certainly interested in that.  Please see the "Patient Instructions" section for addition details about the plan.  Owens Loffler, MD [facility] 04/11/2018, 2:23 PM

## 2018-04-25 ENCOUNTER — Encounter: Payer: BLUE CROSS/BLUE SHIELD | Admitting: Gastroenterology

## 2018-05-01 ENCOUNTER — Other Ambulatory Visit: Payer: Self-pay

## 2018-05-01 ENCOUNTER — Encounter: Payer: Self-pay | Admitting: Gastroenterology

## 2018-05-01 ENCOUNTER — Ambulatory Visit (AMBULATORY_SURGERY_CENTER): Payer: BLUE CROSS/BLUE SHIELD | Admitting: Gastroenterology

## 2018-05-01 VITALS — BP 102/67 | HR 79 | Temp 99.1°F | Resp 12 | Ht 71.0 in | Wt 213.0 lb

## 2018-05-01 DIAGNOSIS — D122 Benign neoplasm of ascending colon: Secondary | ICD-10-CM

## 2018-05-01 DIAGNOSIS — K51919 Ulcerative colitis, unspecified with unspecified complications: Secondary | ICD-10-CM

## 2018-05-01 DIAGNOSIS — D123 Benign neoplasm of transverse colon: Secondary | ICD-10-CM

## 2018-05-01 DIAGNOSIS — K635 Polyp of colon: Secondary | ICD-10-CM | POA: Diagnosis not present

## 2018-05-01 MED ORDER — SODIUM CHLORIDE 0.9 % IV SOLN: 500.0000 mL | Freq: Once | INTRAVENOUS | Status: AC

## 2018-05-01 NOTE — Progress Notes (Signed)
Report given to PACU, vss 

## 2018-05-01 NOTE — Patient Instructions (Signed)
*  Handout given on polyps.  YOU HAD AN ENDOSCOPIC PROCEDURE TODAY AT [facility]:   Refer to the procedure report that was given to you for any specific questions about what was found during the examination.  If the procedure report does not answer your questions, please call your gastroenterologist to clarify.  If you requested that your care partner not be given the details of your procedure findings, then the procedure report has been included in a sealed envelope for you to review at your convenience later.  YOU SHOULD EXPECT: Some feelings of bloating in the abdomen. Passage of more gas than usual.  Walking can help get rid of the air that was put into your GI tract during the procedure and reduce the bloating. If you had a lower endoscopy (such as a colonoscopy or flexible sigmoidoscopy) you may notice spotting of blood in your stool or on the toilet paper. If you underwent a bowel prep for your procedure, you may not have a normal bowel movement for a few days.  Please Note:  You might notice some irritation and congestion in your nose or some drainage.  This is from the oxygen used during your procedure.  There is no need for concern and it should clear up in a day or so.  SYMPTOMS TO REPORT IMMEDIATELY:   Following lower endoscopy (colonoscopy or flexible sigmoidoscopy):  Excessive amounts of blood in the stool  Significant tenderness or worsening of abdominal pains  Swelling of the abdomen that is new, acute  Fever of 100F or higher   For urgent or emergent issues, a gastroenterologist can be reached at any hour by calling [phone].   DIET:  We do recommend a small meal at first, but then you may proceed to your regular diet.  Drink plenty of fluids but you should avoid alcoholic beverages for 24 hours.  ACTIVITY:  You should plan to take it easy for the rest of today and you should NOT DRIVE or use heavy machinery until tomorrow (because of the sedation  medicines used during the test).    FOLLOW UP: Our staff will call the number listed on your records the next business day following your procedure to check on you and address any questions or concerns that you may have regarding the information given to you following your procedure. If we do not reach you, we will leave a message.  However, if you are feeling well and you are not experiencing any problems, there is no need to return our call.  We will assume that you have returned to your regular daily activities without incident.  If any biopsies were taken you will be contacted by phone or by letter within the next 1-3 weeks.  Please call us at [phone] if you have not heard about the biopsies in 3 weeks.    SIGNATURES/CONFIDENTIALITY: You and/or your care partner have signed paperwork which will be entered into your electronic medical record.  These signatures attest to the fact that that the information above on your After Visit Summary has been reviewed and is understood.  Full responsibility of the confidentiality of this discharge information lies with you and/or your care-partner.

## 2018-05-01 NOTE — Op Note (Signed)
Stephen Henson Patient Name: Stephen Henson Procedure Date: 05/01/2018 10:28 AM MRN: [id number] Endoscopist: Milus Banister , MD Age: 47 Referring MD:  Date of Birth: September 27, 1971 Gender: Male Account #: [id number] Procedure:                Colonoscopy Indications:              Screening for colorectal malignant neoplasm,                            elevated risk given longstanding extensive                            ulcerative colitis (diagnosed 2007), under good                            control clinically with immunomodulator Medicines:                Monitored Anesthesia Care Procedure:                Pre-Anesthesia Assessment:                           - Prior to the procedure, a History and Physical                            was performed, and patient medications and                            allergies were reviewed. The patient's tolerance of                            previous anesthesia was also reviewed. The risks                            and benefits of the procedure and the sedation                            options and risks were discussed with the patient.                            All questions were answered, and informed consent                            was obtained. Prior Anticoagulants: The patient has                            taken no previous anticoagulant or antiplatelet                            agents. ASA Grade Assessment: II - A patient with                            mild systemic disease. After reviewing the risks  and benefits, the patient was deemed in                            satisfactory condition to undergo the procedure.                           After obtaining informed consent, the colonoscope                            was passed under direct vision. Throughout the                            procedure, the patient's blood pressure, pulse, and                            oxygen saturations were monitored  continuously. The                            Colonoscope was introduced through the anus and                            advanced to the the terminal ileum. The colonoscopy                            was performed without difficulty. The patient                            tolerated the procedure well. The quality of the                            bowel preparation was good. The terminal ileum,                            ileocecal valve, appendiceal orifice, and rectum                            were photographed. Scope In: 10:31:58 AM Scope Out: 10:46:35 AM Scope Withdrawal Time: 0 hours 12 minutes 48 seconds  Total Procedure Duration: 0 hours 14 minutes 37 seconds  Findings:                 The terminal ileum appeared normal.                           Innumerable polypoid lesions throughout the colon,                            consistent with pseudopolyps. These were sampled                            with cold snare polypectomy. jar 2                           No overtly active inflammation in the colon. The  mucosa was sampled extensively in right colon (jar                            1) and left colon (jar 3)                           The exam was otherwise without abnormality on                            direct and retroflexion views. Complications:            No immediate complications. Estimated blood loss:                            None. Estimated Blood Loss:     Estimated blood loss: none. Impression:               - The examined portion of the ileum was normal.                           - Innumerable polypoid lesions throughout the                            colon, morpholigcally consistent with pseudopolyps.                            These were sampled with cold snare polypectomy. jar                            - No overtly active inflammation in the colon. The                            mucosa was sampled extensively in right colon (jar                             1) and left colon (jar 3)                           - The examination was otherwise normal on direct                            and retroflexion views. Recommendation:           - Patient has a contact number available for                            emergencies. The signs and symptoms of potential                            delayed complications were discussed with the                            patient. Return to normal activities tomorrow.  Written discharge instructions were provided to the                            patient.                           - Resume previous diet.                           - Continue present medications.                           - Await pathology results to determine timing of                            next screening examination. Milus Banister, MD 05/01/2018 10:53:22 AM This report has been signed electronically.

## 2018-05-01 NOTE — Progress Notes (Signed)
Called to room to assist during endoscopic procedure.  Patient ID and intended procedure confirmed with present staff. Received instructions for my participation in the procedure from the performing physician.  

## 2018-05-02 ENCOUNTER — Telehealth: Payer: Self-pay | Admitting: *Deleted

## 2018-05-02 NOTE — Telephone Encounter (Signed)
No answer, message left for the patient. 

## 2018-05-09 ENCOUNTER — Encounter: Payer: Self-pay | Admitting: Gastroenterology

## 2018-07-18 ENCOUNTER — Telehealth: Payer: Self-pay

## 2018-07-18 NOTE — Telephone Encounter (Signed)
Patient is due for 3 month labs. Several attempts to contact him have been made with voicemail's to contact the office. Patient has not returned call, and a letter has been mailed to the patient that lab work is due.

## 2018-09-25 ENCOUNTER — Telehealth: Payer: Self-pay | Admitting: Gastroenterology

## 2018-09-25 DIAGNOSIS — K51919 Ulcerative colitis, unspecified with unspecified complications: Secondary | ICD-10-CM

## 2018-09-25 NOTE — Telephone Encounter (Signed)
Lab order has been faxed to the PCP and results requested to be faxed back to our office.

## 2018-09-25 NOTE — Telephone Encounter (Signed)
Pt is requesting orders for labs be faxed to his PCP Dr. Cherlyn Cushing at [facility] in [place] as he will be seeing him this week for his annual PE and Dr. Cherlyn Cushing will order other labs for him as well. PCP phone is [phone], fax is [phone].

## 2018-10-03 ENCOUNTER — Telehealth: Payer: Self-pay | Admitting: Gastroenterology

## 2018-10-03 NOTE — Telephone Encounter (Signed)
The pt has been advised and will have labs done locally and have faxed here for review.

## 2018-10-03 NOTE — Telephone Encounter (Signed)
Outside labs reviewed.  Dated September 27, 2018.  Complete metabolic profile was normal.  CBC was completely normal except for slightly elevated MCV.   Please call him.  Let him know that I reviewed his labs.  All looks good.  No changes in his current IBD medicines.  He needs repeat CBC and complete metabolic profile in 4 months.  That can be done locally and sent here for review again.

## 2019-02-14 ENCOUNTER — Other Ambulatory Visit: Payer: Self-pay | Admitting: Gastroenterology

## 2019-03-30 ENCOUNTER — Telehealth: Payer: Self-pay | Admitting: Gastroenterology

## 2019-03-30 DIAGNOSIS — K51919 Ulcerative colitis, unspecified with unspecified complications: Secondary | ICD-10-CM

## 2019-03-30 NOTE — Telephone Encounter (Signed)
Lab order faxed to facility to be done and faxed to [place] when resulted.  Pt aware

## 2019-11-05 ENCOUNTER — Telehealth: Payer: Self-pay | Admitting: Gastroenterology

## 2019-11-05 NOTE — Telephone Encounter (Signed)
The pt advised that I do not see that he is due for labs.  He will keep appt as scheduled.

## 2019-11-16 ENCOUNTER — Other Ambulatory Visit: Payer: Self-pay | Admitting: Gastroenterology

## 2020-05-12 ENCOUNTER — Telehealth: Payer: Self-pay | Admitting: Gastroenterology

## 2020-05-12 NOTE — Telephone Encounter (Signed)
I think it is OK to write a letter stating that he has IBD.  Please go ahead with that.  Also, he needs OV with me in the next 2-3 months.  Thanks

## 2020-05-12 NOTE — Telephone Encounter (Signed)
Pt states that he needs a letter typed up stating he suffers from inflammatory bowel disease so he can submit to his employer. Pt states this can be addressed to him so he can submit to his employer. Pt would prefer for this to be sent through [place] since he stays in [place].

## 2020-05-12 NOTE — Telephone Encounter (Signed)
Dr Ardis Hughs please advise.  The pt has not been seen since 2019 and has moved to [place].

## 2020-05-12 NOTE — Telephone Encounter (Signed)
The letter has been written and sent to the pt as requested via My Chart.  Also, the pt notified to call for an appt in 2-3 months for a follow up.  The pt has been advised of the information and verbalized understanding.

## 2020-06-27 ENCOUNTER — Other Ambulatory Visit: Payer: Self-pay | Admitting: Gastroenterology

## 2020-06-27 NOTE — Telephone Encounter (Signed)
Patty this patient has not been seen in 2 years, how is Dr Ardis Hughs on his refills as far as a IBD patient   Thanks

## 2020-09-09 ENCOUNTER — Other Ambulatory Visit: Payer: Self-pay | Admitting: Gastroenterology

## 2020-09-15 ENCOUNTER — Other Ambulatory Visit (INDEPENDENT_AMBULATORY_CARE_PROVIDER_SITE_OTHER): Payer: 59

## 2020-09-15 ENCOUNTER — Encounter: Payer: Self-pay | Admitting: Gastroenterology

## 2020-09-15 ENCOUNTER — Ambulatory Visit (INDEPENDENT_AMBULATORY_CARE_PROVIDER_SITE_OTHER): Payer: 59 | Admitting: Gastroenterology

## 2020-09-15 VITALS — BP 130/88 | HR 96 | Ht 72.0 in | Wt 218.2 lb

## 2020-09-15 DIAGNOSIS — K51919 Ulcerative colitis, unspecified with unspecified complications: Secondary | ICD-10-CM | POA: Diagnosis not present

## 2020-09-15 LAB — CBC
HCT: 48.1 % (ref 39.0–52.0)
Hemoglobin: 16.6 g/dL (ref 13.0–17.0)
MCHC: 34.4 g/dL (ref 30.0–36.0)
MCV: 108.1 fl — ABNORMAL HIGH (ref 78.0–100.0)
Platelets: 225 10*3/uL (ref 150.0–400.0)
RBC: 4.44 Mil/uL (ref 4.22–5.81)
RDW: 15.5 % (ref 11.5–15.5)
WBC: 6.8 10*3/uL (ref 4.0–10.5)

## 2020-09-15 LAB — COMPREHENSIVE METABOLIC PANEL
ALT: 23 U/L (ref 0–53)
AST: 22 U/L (ref 0–37)
Albumin: 4.3 g/dL (ref 3.5–5.2)
Alkaline Phosphatase: 50 U/L (ref 39–117)
BUN: 14 mg/dL (ref 6–23)
CO2: 26 mEq/L (ref 19–32)
Calcium: 9.2 mg/dL (ref 8.4–10.5)
Chloride: 103 mEq/L (ref 96–112)
Creatinine, Ser: 1.12 mg/dL (ref 0.40–1.50)
GFR: 77.85 mL/min (ref >60.00)
Glucose, Bld: 114 mg/dL — ABNORMAL HIGH (ref 70–99)
Potassium: 3.7 mEq/L (ref 3.5–5.1)
Sodium: 137 mEq/L (ref 135–145)
Total Bilirubin: 0.9 mg/dL (ref 0.2–1.2)
Total Protein: 6.5 g/dL (ref 6.0–8.3)

## 2020-09-15 LAB — C-REACTIVE PROTEIN: CRP: <1 mg/dL (ref 0.5–20.0)

## 2020-09-15 LAB — SEDIMENTATION RATE: Sed Rate: 4 mm/hr (ref 0–15)

## 2020-09-15 NOTE — Progress Notes (Signed)
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 Colonoscopy March 2007 showed mild inflammation up to 15 cm. Biopsies proved chronic active colitis, responded very well to daily Canasa suppositories. Repeat colonoscopy October 2009 during new flare showed moderate to severe pan colitis. TI was normal, path + chronic inflammation.  Responded to steroids quickly. He had not been taking asacol except for intermittent minor flares. Flex sigmoidoscopy May, 2010: Severe, left-sided predominately colitis with a transition at 30 cm. Also a tubular adenoma was removed. 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 June, 2010 increased azathioprine 150 mg a day. October, 2010 increased azathioprine to 200 mg a day. Stopped remicade, per patient request, and on solo azathiaprian October 2010. December 2011, doing well on azathiaprine 2082ma day for over a year now. February, 2012 doing well on azathioprine 200 mg a day. June 2012 continues to do well on 20070mzathiaprine a day. Labs 10/12 show normal LFTs, normal WBC, Hb (MCV 108).  2016 cut back azathiaprine to 150m15mily because of warts on his hands, concern that the immunomodulator was contributint.  Colonoscopy June 2019 for high risk screening showed pseudopolyps, biopsies of those and of the right and left colon showed no sign of active or chronic inflammation or any dysplasia. IBD description: (pan colitis, currently well controled, no exteranstinal manifestations.)   Corticosteroid sparing therapy prescribed: yes   Bone loss assessment   Vitamin D 25-OH level; April 2014   Bone Density Assessment; recommended April 2014, scheduling conflict  TB testing prior to Anti TNF therapy: NA   Hep B surface Ag, Hep B surface  Antibody before Anti TNF therapy: NA   Influenza immunization : April 2014 he [place] annually [place] ; done April 2014   Tobacco Screening and counciling (every 2 years); yes   2. Duodenal ulcer, EGD october 2009, in setting of NSAIDs/steroids. Biopsies of stomach showed no H. Pylori   HPI: This is a very pleasant 49 y26r old man  I Last saw Stephen Henson 2019 at the time of a colonoscopy for high risk screening given his longstanding extensive ulcerative colitis.  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  There was no overtly active inflammation.  I biopsied the right and left colon extensively.  I also sampled some of the pseudopolyps.  The biopsies from his right and left colon showed no chronic or active inflammation.  The biopsies from the polyps showed no precancerous change.  For the past 4 months he has had increased frequency, urgency and he has noticed some blood in his stools as well about once or twice a week.  He is having 3-5 loose stools daily.  This is much different from his general habit of once daily.  He has had urgency to the point of having fecal incontinence.  He has had no abdominal pains.  He does admit that shortly before these symptoms started he was not so good about taking his azathioprine every single day.  Since the symptoms started he has been very vigilant about it.  He also smokes 1 cigarette every morning.  His weight is overall stable.  He has not been on any antibiotics in the past 6 or 8 months at least.  ROS: complete GI ROS as described in HPI, all other review negative.  Constitutional:  No unintentional weight loss   Past Medical History:  Diagnosis Date  . Abnormal LFTs   . Colitis    PAN  . Depression   . Duodenal ulcer   . Heartburn   . Proctitis   . Testicular disorder   . Tubular adenoma of colon   . Ulcerative colitis ([place])     History reviewed. No pertinent surgical  history.  Current Outpatient Medications  Medication Sig Dispense Refill  . azaTHIOprine (IMURAN) 50 MG tablet TAKE 3 TABLETS BY MOUTH EVERY DAY 270 tablet 1  . Cholecalciferol (VITAMIN D) 2000 UNITS CAPS Take 5,000 Units by mouth.    . DULoxetine (CYMBALTA) 60 MG capsule Take 60 mg by mouth daily.    Marland Kitchen KRILL OIL PO Take by mouth.    . Lactobacillus (ACIDOPHILUS PO) Take by mouth.    Marland Kitchen lisinopril (ZESTRIL) 10 MG tablet Take 10 mg by mouth daily.    Marland Kitchen LORazepam (ATIVAN) 2 MG tablet Take 2 mg by mouth every 6 (six) hours as needed for anxiety. Patient takes once daily    . milk thistle 175 MG tablet Take 175 mg by mouth daily.    . Misc Natural Products (OSTEO BI-FLEX JOINT SHIELD PO) Take by mouth.    . Multiple Vitamins-Minerals (MULTI COMPLETE PO) Take by mouth.    . saw palmetto 500 MG capsule Take 500 mg by mouth daily.    Marland Kitchen testosterone cypionate (DEPOTESTOTERONE CYPIONATE) 100 MG/ML injection Inject into the muscle every 14 (fourteen) days. For IM use only     Current Facility-Administered Medications  Medication Dose Route Frequency Provider Last Rate Last Admin  . 0.9 %  sodium chloride infusion  500 mL Intravenous Once Milus Banister, MD        Allergies as of 09/15/2020  . (No Known Allergies)    Family History  Problem Relation Age of Onset  . Diabetes Mother   . COPD Father   . Colon cancer Neg Hx   . Stomach cancer Neg Hx   . Rectal cancer Neg Hx     Social History   Socioeconomic History  . Marital status: Married    Spouse name: Not on file  . Number of children: 0  . Years of education: Not on file  . Highest education level: Not on file  Occupational History  . Occupation: Human resources officer: UNEMPLOYED    Employer: [employer]  Tobacco Use  . Smoking status: Current Every Day Smoker    Packs/day: 1.00    Years: 10.00    Pack years: 10.00  . Smokeless tobacco: Never Used  Vaping Use  . Vaping Use: Every day  Substance and Sexual  Activity  . Alcohol use: Yes    Comment: varies  . Drug use: No  . Sexual activity: Not on file  Other Topics Concern  . Not on file  Social History Narrative  . Not on file   Social Determinants of Health   Financial Resource Strain:   . Difficulty of Paying Living Expenses: Not on file  Food Insecurity:   . Worried About Charity fundraiser in the Last Year: Not on file  . Ran Out of Food in the Last Year: Not on file  Transportation Needs:   . Lack of Transportation (Medical): Not on file  . Lack of Transportation (Non-Medical): Not on file  Physical Activity:   . Days of Exercise per Week: Not on file  .  Minutes of Exercise per Session: Not on file  Stress:   . Feeling of Stress : Not on file  Social Connections:   . Frequency of Communication with Friends and Family: Not on file  . Frequency of Social Gatherings with Friends and Family: Not on file  . Attends Religious Services: Not on file  . Active Member of Clubs or Organizations: Not on file  . Attends Archivist Meetings: Not on file  . Marital Status: Not on file  Intimate Partner Violence:   . Fear of Current or Ex-Partner: Not on file  . Emotionally Abused: Not on file  . Physically Abused: Not on file  . Sexually Abused: Not on file     Physical Exam: BP 130/88   Pulse 96   Ht 6' (1.829 m)   Wt 218 lb 4 oz (99 kg)   BMI 29.60 kg/m  Constitutional: generally well-appearing Psychiatric: alert and oriented x3 Abdomen: soft, nontender, nondistended, no obvious ascites, no peritoneal signs, normal bowel sounds No peripheral edema noted in lower extremities  Assessment and plan: 49 y.o. male with extensive ulcerative colitis  Think it is very possible he is having a flare of his ulcerative colitis.  He is going to get blood test and stool testing today including CBC, complete metabolic profile, inflammatory markers and GI pathogen panel.  Pending those results I will likely start him on brief  prednisone course 40 mg a day with a 2 to 4-week taper.  I will also likely increase his azathioprine back to 200 mg daily.  He and I discussed that we had decreased him several years ago because of some warts on his hands.  Decreasing him however has not made any difference in the warts.  He understands that he may need further testing then even above pending his clinical course including a colonoscopy.  Please see the "Patient Instructions" section for addition details about the plan.  Owens Loffler, MD [facility] 09/15/2020, 10:19 AM   Total time on date of encounter was 30 minutes (this included time spent preparing to see the patient reviewing records; obtaining and/or reviewing separately obtained history; performing a medically appropriate exam and/or evaluation; counseling and educating the patient and family if present; ordering medications, tests or procedures if applicable; and documenting clinical information in the health record).

## 2020-09-15 NOTE — Patient Instructions (Addendum)
If you are age 48 or older, your body mass index should be between 23-30. Your Body mass index is 29.6 kg/m. If this is out of the aforementioned range listed, please consider follow up with your Primary Care Provider.  If you are age 44 or younger, your body mass index should be between 19-25. Your Body mass index is 29.6 kg/m. If this is out of the aformentioned range listed, please consider follow up with your Primary Care Provider.   Your provider has requested that you go to the basement level for lab work before leaving today. Press "B" on the elevator. The lab is located at the first door on the left as you exit the elevator.  Due to recent changes in healthcare laws, you may see the results of your imaging and laboratory studies on MyChart before your provider has had a chance to review them.  We understand that in some cases there may be results that are confusing or concerning to you. Not all laboratory results come back in the same time frame and the provider may be waiting for multiple results in order to interpret others.  Please give [place] 48 hours in order for your provider to thoroughly review all the results before contacting the office for clarification of your results.   Thank you for entrusting me with your care and choosing [facility].  Dr Ardis Hughs

## 2020-09-18 ENCOUNTER — Other Ambulatory Visit: Payer: Self-pay

## 2020-09-18 LAB — GI PROFILE, STOOL, PCR

## 2020-09-18 MED ORDER — AZATHIOPRINE 50 MG PO TABS: 200.0000 mg | ORAL_TABLET | Freq: Every day | ORAL | 3 refills | Status: AC

## 2020-09-18 MED ORDER — PREDNISONE 10 MG PO TABS: ORAL_TABLET | ORAL | 0 refills | Status: DC

## 2020-09-18 MED ORDER — PREDNISONE 10 MG PO TABS: ORAL_TABLET | ORAL | 0 refills | Status: AC

## 2021-03-26 ENCOUNTER — Telehealth: Payer: Self-pay | Admitting: Gastroenterology

## 2021-03-26 NOTE — Telephone Encounter (Signed)
The pt called from [place] stating he is  having trouble swallowing and was seen in the ED in [place].  He was advised that he needs to have an EGD.  He was told he could not be seen until June 7 so he called here to see if we could get him scheduled for direct EGD sooner.  I advised he would need to be seen in our office for eval and he could get scheduled if needed from that point.  He did not want to schedule an office visit because he would have to drive from [place] for the appt then back for procedure.  He thanked me and states he will get an appt in [place].

## 2021-03-26 NOTE — Telephone Encounter (Signed)
Inbound call from patient requesting a call from a nurse please.  Wants to see if he can have an EGD done; states he is having severe issues swallowing.  Please advise.

## 2021-09-21 ENCOUNTER — Other Ambulatory Visit: Payer: Self-pay
